# Patient Record
Sex: Female | Born: 1985 | Race: White | Hispanic: No | Marital: Single | Smoking: Former smoker
Health system: Southern US, Community
[De-identification: ages and names within clinical notes are randomized; demographics above are authoritative.]

## PROBLEM LIST (undated history)

## (undated) ENCOUNTER — Inpatient Hospital Stay (HOSPITAL_COMMUNITY): Payer: Self-pay

## (undated) DIAGNOSIS — F909 Attention-deficit hyperactivity disorder, unspecified type: Secondary | ICD-10-CM

## (undated) DIAGNOSIS — B009 Herpesviral infection, unspecified: Secondary | ICD-10-CM

## (undated) HISTORY — PX: NO PAST SURGERIES: SHX2092

---
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
## 2008-07-03 ENCOUNTER — Ambulatory Visit: Payer: Self-pay | Admitting: Diagnostic Radiology

## 2008-07-03 ENCOUNTER — Emergency Department (HOSPITAL_BASED_OUTPATIENT_CLINIC_OR_DEPARTMENT_OTHER): Admission: EM | Admit: 2008-07-03 | Discharge: 2008-07-03 | Payer: Self-pay | Admitting: Emergency Medicine

## 2009-10-27 ENCOUNTER — Inpatient Hospital Stay (HOSPITAL_COMMUNITY): Admission: AD | Admit: 2009-10-27 | Discharge: 2009-10-27 | Payer: Self-pay | Admitting: Obstetrics & Gynecology

## 2009-12-27 ENCOUNTER — Inpatient Hospital Stay (HOSPITAL_COMMUNITY): Admission: AD | Admit: 2009-12-27 | Discharge: 2009-12-30 | Payer: Self-pay | Admitting: Obstetrics and Gynecology

## 2010-04-26 LAB — CBC
HCT: 30.5 % — ABNORMAL LOW (ref 36.0–46.0)
HCT: 39.4 % (ref 36.0–46.0)
Hemoglobin: 10.3 g/dL — ABNORMAL LOW (ref 12.0–15.0)
Hemoglobin: 13.4 g/dL (ref 12.0–15.0)
MCH: 32.3 pg (ref 26.0–34.0)
MCH: 32.3 pg (ref 26.0–34.0)
MCHC: 33.7 g/dL (ref 30.0–36.0)
MCHC: 34.1 g/dL (ref 30.0–36.0)
MCV: 94.7 fL (ref 78.0–100.0)
MCV: 95.8 fL (ref 78.0–100.0)
Platelets: 198 10*3/uL (ref 150–400)
Platelets: 232 10*3/uL (ref 150–400)
RBC: 3.19 MIL/uL — ABNORMAL LOW (ref 3.87–5.11)
RBC: 4.16 MIL/uL (ref 3.87–5.11)
RDW: 12.1 % (ref 11.5–15.5)
RDW: 12.4 % (ref 11.5–15.5)
WBC: 15.4 10*3/uL — ABNORMAL HIGH (ref 4.0–10.5)
WBC: 19.9 10*3/uL — ABNORMAL HIGH (ref 4.0–10.5)

## 2010-04-26 LAB — RPR: RPR Ser Ql: NONREACTIVE

## 2010-04-28 LAB — URINALYSIS, ROUTINE W REFLEX MICROSCOPIC
Bilirubin Urine: NEGATIVE
Glucose, UA: NEGATIVE mg/dL
Ketones, ur: NEGATIVE mg/dL
Leukocytes, UA: NEGATIVE
Nitrite: NEGATIVE
Protein, ur: NEGATIVE mg/dL
Specific Gravity, Urine: 1.015 (ref 1.005–1.030)
Urobilinogen, UA: 0.2 mg/dL (ref 0.0–1.0)
pH: 7 (ref 5.0–8.0)

## 2010-04-28 LAB — URINE MICROSCOPIC-ADD ON

## 2010-10-24 ENCOUNTER — Emergency Department (HOSPITAL_BASED_OUTPATIENT_CLINIC_OR_DEPARTMENT_OTHER)
Admission: EM | Admit: 2010-10-24 | Discharge: 2010-10-24 | Disposition: A | Discharge status: Home / Self Care | Payer: Self-pay | Attending: Emergency Medicine | Admitting: Emergency Medicine

## 2010-10-24 DIAGNOSIS — R509 Fever, unspecified: Secondary | ICD-10-CM | POA: Insufficient documentation

## 2010-10-24 DIAGNOSIS — B9789 Other viral agents as the cause of diseases classified elsewhere: Secondary | ICD-10-CM | POA: Insufficient documentation

## 2010-10-24 DIAGNOSIS — J988 Other specified respiratory disorders: Secondary | ICD-10-CM

## 2010-10-24 LAB — URINALYSIS, ROUTINE W REFLEX MICROSCOPIC
Bilirubin Urine: NEGATIVE
Leukocytes, UA: NEGATIVE
Nitrite: NEGATIVE
Specific Gravity, Urine: 1.007 (ref 1.005–1.030)
Urobilinogen, UA: 1 mg/dL (ref 0.0–1.0)
pH: 6.5 (ref 5.0–8.0)

## 2010-10-24 MED ORDER — ACETAMINOPHEN 325 MG PO TABS
650.0000 mg | ORAL_TABLET | Freq: Once | ORAL | Status: AC
  Administered 2010-10-24: 650 mg via ORAL
  Filled 2010-10-24: qty 2

## 2010-10-24 NOTE — ED Notes (Signed)
Fever-was seen at [facility]-?dx -rx amoxil, tessalon perles

## 2010-10-24 NOTE — ED Provider Notes (Signed)
History     CSN: [id number] Arrival date & time: 10/24/2010  8:57 PM  Chief Complaint  Patient presents with  . Fever   The history is provided by the patient.  SEEN AT [facility] Saturday INTO Sunday FOR FEVER, SORETHROAT, BODY ACHES COUGH, AT TIMES PRODUCTIVE, MILD HEADACHE, SOME VOMITING NOW RESOLVED. SOME DIZZZINESS. THERE GIVEN FLUIDS BLOOD CULTURE LABS AND CXR. XRAY WAS NEGATIVE. RX WITH AMOXICILLIN, NOT BETTER. NO SICK CONTACT EX FOR SON WITH MILDER SIMILAR ILLNESS. FEVER TO 102 TMAX.   History reviewed. No pertinent past medical history.  History reviewed. No pertinent past surgical history.  No family history on file.  History  Substance Use Topics  . Smoking status: Former Games developer  . Smokeless tobacco: Not on file  . Alcohol Use: Yes    OB History    Grav Para Term Preterm Abortions TAB SAB Ect Mult Living                  Review of Systems  Constitutional: Positive for fever, chills and fatigue.  HENT: Positive for congestion and sore throat. Negative for ear pain, trouble swallowing, neck pain and neck stiffness.   Eyes: Negative for photophobia and redness.  Respiratory: Positive for cough. Negative for chest tightness, shortness of breath and wheezing.   Cardiovascular: Negative for chest pain, palpitations and leg swelling.  Gastrointestinal: Negative for nausea, vomiting, abdominal pain and diarrhea.  Genitourinary: Negative for dysuria and hematuria.  Musculoskeletal: Positive for myalgias. Negative for back pain.  Skin: Negative for rash.  Neurological: Positive for dizziness and headaches. Negative for syncope, weakness and numbness.  Psychiatric/Behavioral: Negative for confusion.    Physical Exam  BP 113/79  Pulse 104  Temp(Src) 102.9 F (39.4 C) (Oral)  Resp 18  Ht 5\' 2"  (1.575 m)  Wt 123 lb (55.792 kg)  BMI 22.50 kg/m2  SpO2 99%  LMP 09/29/2010  Physical Exam  Nursing note and vitals reviewed. Constitutional: She is oriented to  person, place, and time. She appears well-developed and well-nourished. No distress.  HENT:  Head: Normocephalic and atraumatic.  Right Ear: External ear normal.  Left Ear: External ear normal.  Mouth/Throat: Oropharynx is clear and moist.  Eyes: Conjunctivae and EOM are normal. Pupils are equal, round, and reactive to light. No scleral icterus.  Neck: Normal range of motion. Neck supple.  Cardiovascular: Normal rate, regular rhythm and normal heart sounds.   No murmur heard.      SLIGHTLY TACHY  Pulmonary/Chest: Effort normal and breath sounds normal. No respiratory distress. She exhibits no tenderness.  Abdominal: Soft. Bowel sounds are normal. There is no tenderness.  Musculoskeletal: Normal range of motion. She exhibits no edema.  Lymphadenopathy:    She has no cervical adenopathy.  Neurological: She is alert and oriented to person, place, and time. She displays normal reflexes. No cranial nerve deficit. She exhibits normal muscle tone. Coordination normal.  Skin: Skin is warm and dry. No rash noted. No erythema.       PRE-EXISTING FOLLICULITIS TO ARMS AND LEGS LONG STANDING.     ED Course  Procedures  Results for orders placed during the hospital encounter of 10/24/10  URINALYSIS, ROUTINE W REFLEX MICROSCOPIC      Component Value Range   Color, Urine YELLOW  YELLOW    Appearance CLEAR  CLEAR    Specific Gravity, Urine 1.007  1.005 - 1.030    pH 6.5  5.0 - 8.0    Glucose, UA NEGATIVE  NEGATIVE (mg/dL)   Hgb urine dipstick NEGATIVE  NEGATIVE    Bilirubin Urine NEGATIVE  NEGATIVE    Ketones, ur NEGATIVE  NEGATIVE (mg/dL)   Protein, ur NEGATIVE  NEGATIVE (mg/dL)   Urobilinogen, UA 1.0  0.0 - 1.0 (mg/dL)   Nitrite NEGATIVE  NEGATIVE    Leukocytes, UA NEGATIVE  NEGATIVE      MDM OFFERED MONO TESTING BUT PATIENT REFUSES WANTS TO GO HOME. MOST LIKELY VIRAL URI LIKE ILLNESS ALREADY ON AMOXICILLIN WHICH WOULD COVER STREP AND JUST HAD NEGATIVE CXR YESTERDAY AT [facility].  SIG FEVER BUT NOT TOXIC. ALSO HAD BLOOD CULTURE AND LAB WORK YESTERDAY.      Shelda Jakes, MD 10/25/10 0530

## 2010-10-24 NOTE — ED Notes (Signed)
Pt states she does not want blood drawn.

## 2010-10-24 NOTE — ED Notes (Signed)
Pt was seen at [facility] sat for same given abx and tesslon pearls for cough. Pt states she is still running fever and has body aches. Pt states sore throat is improved. Pt has fever on arrival to ED tonight, but has not taken any Tylenol for fever today.

## 2010-10-31 ENCOUNTER — Encounter (HOSPITAL_BASED_OUTPATIENT_CLINIC_OR_DEPARTMENT_OTHER): Payer: Self-pay | Admitting: *Deleted

## 2010-10-31 ENCOUNTER — Emergency Department (HOSPITAL_BASED_OUTPATIENT_CLINIC_OR_DEPARTMENT_OTHER)
Admission: EM | Admit: 2010-10-31 | Discharge: 2010-10-31 | Disposition: A | Discharge status: Home / Self Care | Payer: Self-pay | Attending: Emergency Medicine | Admitting: Emergency Medicine

## 2010-10-31 DIAGNOSIS — H6592 Unspecified nonsuppurative otitis media, left ear: Secondary | ICD-10-CM

## 2010-10-31 DIAGNOSIS — H9209 Otalgia, unspecified ear: Secondary | ICD-10-CM | POA: Insufficient documentation

## 2010-10-31 DIAGNOSIS — H6590 Unspecified nonsuppurative otitis media, unspecified ear: Secondary | ICD-10-CM | POA: Insufficient documentation

## 2010-10-31 MED ORDER — ANTIPYRINE-BENZOCAINE 5.4-1.4 % OT SOLN
2.0000 [drp] | Freq: Once | OTIC | Status: AC
  Administered 2010-10-31: 2 [drp] via OTIC
  Filled 2010-10-31: qty 10

## 2010-10-31 MED ORDER — ZITHROMAX Z-PAK 250 MG PO TABS: 250.0000 mg | ORAL_TABLET | Freq: Every day | ORAL | Status: AC

## 2010-10-31 NOTE — ED Notes (Signed)
Pt states she has had left ear pressure and sensation of fullness since last Tuesday.  Pt states her hearing in left ear is increasingly muffled.  Pt describes sensation as "a bunch of cotton balls in there."  Pt also has cough productive of white sputum.  Pt was seen here on Monday past for fever, cough/sinus congestion and at [facility] for same the Saturday before.  Pt denies fever and N/V currently.  Pt's left tympanic membrane is red, but intact.

## 2010-10-31 NOTE — ED Provider Notes (Addendum)
History     CSN: [id number] Arrival date & time: 10/31/2010  9:41 AM   Chief Complaint  Patient presents with  . Otalgia    Right ear     (Include location/radiation/quality/duration/timing/severity/associated sxs/prior treatment) Patient is a 25 y.o. female presenting with ear pain. The history is provided by the patient.  Otalgia The current episode started yesterday. There is pain in the left ear. There has been no fever.   Patient recently treated for sore throat and URI. Has 2 more doses of amoxicillin to complete her course she has had intermittent nasal congestion and rhinorrhea. Mild intermittent dry cough. She does note a new fullness and pain in her left year. Denies any fever or drainage  History reviewed. No pertinent past medical history.   History reviewed. No pertinent past surgical history.  No family history on file.  History  Substance Use Topics  . Smoking status: Former Games developer  . Smokeless tobacco: Not on file  . Alcohol Use: Yes     Occasional ETOH use - social drinker.    OB History    Grav Para Term Preterm Abortions TAB SAB Ect Mult Living                  Review of Systems  HENT: Positive for ear pain.   All other systems reviewed and are negative.    Allergies  Review of patient's allergies indicates no known allergies.  Home Medications   Current Outpatient Rx  Name Route Sig Dispense Refill  . AMOXICILLIN 875 MG PO TABS Oral Take 875 mg by mouth 2 (two) times daily.      . B COMPLEX PO TABS Oral Take 1 tablet by mouth daily.      Marland Kitchen BENZONATATE 100 MG PO CAPS Oral Take 100 mg by mouth 3 (three) times daily as needed. cough     . BIOTIN 5000 PO Oral Take 2 tablets by mouth daily.        Physical Exam    BP 112/75  Pulse 87  Temp(Src) 98.3 F (36.8 C) (Oral)  Resp 20  SpO2 98%  LMP 10/26/2010  Physical Exam  Constitutional: She is oriented to person, place, and time. She appears well-developed and well-nourished.  HENT:    Head: Normocephalic and atraumatic.  Right Ear: External ear normal.  Left Ear: External ear normal.       Mild erythema and shininess to left tympanic membrane. No obvious fluid collection drainage or bleeding.  Eyes: Conjunctivae and EOM are normal. Pupils are equal, round, and reactive to light.  Neck: Neck supple.  Cardiovascular: Normal rate and regular rhythm.  Exam reveals no gallop and no friction rub.   No murmur heard. Pulmonary/Chest: Breath sounds normal. She has no wheezes. She has no rales. She exhibits no tenderness.  Abdominal: Soft. Bowel sounds are normal. She exhibits no distension. There is no tenderness. There is no rebound and no guarding.  Musculoskeletal: Normal range of motion.  Neurological: She is alert and oriented to person, place, and time. No cranial nerve deficit. Coordination normal.  Skin: Skin is warm and dry. No rash noted.  Psychiatric: She has a normal mood and affect.    ED Course  Procedures  Results for orders placed during the hospital encounter of 10/24/10  URINALYSIS, ROUTINE W REFLEX MICROSCOPIC      Component Value Range   Color, Urine YELLOW  YELLOW    Appearance CLEAR  CLEAR    Specific Gravity, Urine  1.007  1.005 - 1.030    pH 6.5  5.0 - 8.0    Glucose, UA NEGATIVE  NEGATIVE (mg/dL)   Hgb urine dipstick NEGATIVE  NEGATIVE    Bilirubin Urine NEGATIVE  NEGATIVE    Ketones, ur NEGATIVE  NEGATIVE (mg/dL)   Protein, ur NEGATIVE  NEGATIVE (mg/dL)   Urobilinogen, UA 1.0  0.0 - 1.0 (mg/dL)   Nitrite NEGATIVE  NEGATIVE    Leukocytes, UA NEGATIVE  NEGATIVE    No results found.   No diagnosis found.   MDM Pt is seen and examined;  Initial history and physical completed.  Will follow.         Kilan Banfill A. Patrica Duel, MD 10/31/10 1004  Feleshia Zundel A. Patrica Duel, MD 10/31/10 1006

## 2012-10-18 ENCOUNTER — Encounter (HOSPITAL_BASED_OUTPATIENT_CLINIC_OR_DEPARTMENT_OTHER): Payer: Self-pay | Admitting: *Deleted

## 2012-10-18 ENCOUNTER — Emergency Department (HOSPITAL_BASED_OUTPATIENT_CLINIC_OR_DEPARTMENT_OTHER)
Admission: EM | Admit: 2012-10-18 | Discharge: 2012-10-18 | Disposition: A | Discharge status: Home / Self Care | Payer: Self-pay | Attending: Emergency Medicine | Admitting: Emergency Medicine

## 2012-10-18 DIAGNOSIS — J029 Acute pharyngitis, unspecified: Secondary | ICD-10-CM | POA: Insufficient documentation

## 2012-10-18 DIAGNOSIS — Z87891 Personal history of nicotine dependence: Secondary | ICD-10-CM | POA: Insufficient documentation

## 2012-10-18 DIAGNOSIS — Z79899 Other long term (current) drug therapy: Secondary | ICD-10-CM | POA: Insufficient documentation

## 2012-10-18 DIAGNOSIS — R509 Fever, unspecified: Secondary | ICD-10-CM | POA: Insufficient documentation

## 2012-10-18 DIAGNOSIS — R51 Headache: Secondary | ICD-10-CM | POA: Insufficient documentation

## 2012-10-18 MED ORDER — LIDOCAINE VISCOUS 2 % MT SOLN
15.0000 mL | Freq: Once | OROMUCOSAL | Status: AC
  Administered 2012-10-18: 15 mL via OROMUCOSAL
  Filled 2012-10-18: qty 15

## 2012-10-18 MED ORDER — AMOXICILLIN 875 MG PO TABS: 875.0000 mg | ORAL_TABLET | Freq: Two times a day (BID) | ORAL | Status: DC

## 2012-10-18 MED ORDER — DEXAMETHASONE 1 MG/ML PO CONC
10.0000 mg | Freq: Once | ORAL | Status: AC
  Administered 2012-10-18: 10 mg via ORAL
  Filled 2012-10-18: qty 1

## 2012-10-18 MED ORDER — AMOXICILLIN 500 MG PO CAPS
500.0000 mg | ORAL_CAPSULE | Freq: Once | ORAL | Status: AC
  Administered 2012-10-18: 500 mg via ORAL
  Filled 2012-10-18: qty 1

## 2012-10-18 NOTE — ED Provider Notes (Signed)
CSN: [id number]     Arrival date & time 10/18/12  0903 History   First MD Initiated Contact with Patient 10/18/12 [phone]     Chief Complaint  Patient presents with  . Sore Throat   (Consider location/radiation/quality/duration/timing/severity/associated sxs/prior Treatment) HPI Patient presents with several days of sore throat, headache, fever. Over the past 24 hours symptoms have worsened, despite using ibuprofen. There is no associated dyspnea, chest pain, vomiting, diarrhea. Fever is subjective. There is no confusion, disorientation, visual changes or discharge. Patient states that she has a family member who has ongoing bacterial infection of his eyes, ears, throat. Patient was in her usual state of health prior to onset of symptoms.  History reviewed. No pertinent past medical history. History reviewed. No pertinent past surgical history. No family history on file. History  Substance Use Topics  . Smoking status: Former Games developer  . Smokeless tobacco: Not on file  . Alcohol Use: No     Comment: Occasional ETOH use - social drinker.   OB History   Grav Para Term Preterm Abortions TAB SAB Ect Mult Living                 Review of Systems  All other systems reviewed and are negative.    Allergies  Review of patient's allergies indicates no known allergies.  Home Medications   Current Outpatient Rx  Name  Route  Sig  Dispense  Refill  . ibuprofen (ADVIL,MOTRIN) 200 MG tablet   Oral   Take 600 mg by mouth every 6 (six) hours as needed for pain.         Marland Kitchen amoxicillin (AMOXIL) 875 MG tablet   Oral   Take 875 mg by mouth 2 (two) times daily.           Marland Kitchen b complex vitamins tablet   Oral   Take 1 tablet by mouth daily.           . benzonatate (TESSALON) 100 MG capsule   Oral   Take 100 mg by mouth 3 (three) times daily as needed. cough          . BIOTIN 5000 PO   Oral   Take 2 tablets by mouth daily.            BP 114/75  Pulse 83  Temp(Src) 98.3 F  (36.8 C) (Oral)  Resp 20  SpO2 100%  LMP 10/11/2012 Physical Exam  Nursing note and vitals reviewed. Constitutional: She is oriented to person, place, and time. She appears well-developed and well-nourished. No distress.  HENT:  Head: Normocephalic and atraumatic.  Prominent tonsils bilaterally without gross exudate.  Uvula is midline, nonedematous.  Eyes: Conjunctivae and EOM are normal.  Cardiovascular: Normal rate and regular rhythm.   Pulmonary/Chest: Effort normal and breath sounds normal. No stridor. No respiratory distress.  Abdominal: She exhibits no distension.  Musculoskeletal: She exhibits no edema.  Lymphadenopathy:       Head (right side): Preauricular adenopathy present.       Head (left side): Preauricular adenopathy present.    She has cervical adenopathy.       Right cervical: Superficial cervical adenopathy present.       Left cervical: Superficial cervical adenopathy present.  Neurological: She is alert and oriented to person, place, and time. No cranial nerve deficit.  Skin: Skin is warm and dry.  Psychiatric: She has a normal mood and affect.    ED Course  Procedures (including critical care time) Labs Review  Labs Reviewed  RAPID STREP SCREEN   Imaging Review No results found.    MDM  No diagnosis found. This young female presents with pharyngitis, headache, fever.  Given the patient's exposure to family member with ongoing strep-like infection, she will receive antibiotics, while strep culture is pending, though the initial rapid strep this was negative. Absent distress, rash, unilateral symptoms, there is little suspicion for a occult abscess or other acute systemic progression.    Gerhard Munch, MD 10/18/12 [phone]

## 2012-10-18 NOTE — ED Notes (Signed)
Patient states she has had a sore throat for the last several days.  Last night felt as if she had a fever and the throat pain worsened.

## 2012-10-21 LAB — CULTURE, GROUP A STREP

## 2013-04-20 ENCOUNTER — Encounter (HOSPITAL_COMMUNITY): Payer: Self-pay | Admitting: *Deleted

## 2013-04-20 ENCOUNTER — Inpatient Hospital Stay (HOSPITAL_COMMUNITY)
Admission: AD | Admit: 2013-04-20 | Discharge: 2013-04-20 | Disposition: A | Discharge status: Home / Self Care | Payer: 59 | Source: Ambulatory Visit | Attending: Obstetrics & Gynecology | Admitting: Obstetrics & Gynecology

## 2013-04-20 DIAGNOSIS — Z87891 Personal history of nicotine dependence: Secondary | ICD-10-CM | POA: Insufficient documentation

## 2013-04-20 DIAGNOSIS — A499 Bacterial infection, unspecified: Secondary | ICD-10-CM

## 2013-04-20 DIAGNOSIS — N760 Acute vaginitis: Secondary | ICD-10-CM

## 2013-04-20 DIAGNOSIS — B9689 Other specified bacterial agents as the cause of diseases classified elsewhere: Secondary | ICD-10-CM | POA: Insufficient documentation

## 2013-04-20 LAB — URINALYSIS, ROUTINE W REFLEX MICROSCOPIC
BILIRUBIN URINE: NEGATIVE
Glucose, UA: NEGATIVE mg/dL
Hgb urine dipstick: NEGATIVE
Ketones, ur: NEGATIVE mg/dL
Leukocytes, UA: NEGATIVE
NITRITE: NEGATIVE
PROTEIN: NEGATIVE mg/dL
SPECIFIC GRAVITY, URINE: 1.025 (ref 1.005–1.030)
UROBILINOGEN UA: 0.2 mg/dL (ref 0.0–1.0)
pH: 6.5 (ref 5.0–8.0)

## 2013-04-20 LAB — WET PREP, GENITAL
TRICH WET PREP: NONE SEEN
Yeast Wet Prep HPF POC: NONE SEEN

## 2013-04-20 LAB — POCT PREGNANCY, URINE: PREG TEST UR: NEGATIVE

## 2013-04-20 MED ORDER — METRONIDAZOLE 500 MG PO TABS: 500.0000 mg | ORAL_TABLET | Freq: Two times a day (BID) | ORAL | Status: DC

## 2013-04-20 NOTE — MAU Provider Note (Signed)
History     CSN: [id number]  Arrival date and time: 04/20/13 1238   First Provider Initiated Contact with Patient 04/20/13 1313      Chief Complaint  Patient presents with  . Vaginitis   HPI [name] is a 28 y.o. G1P1001 who presents to MAU today with complaint of vaginal tenderness and discharge. The patient states that she has noted tenderness of the external genitalia. She denies rash or irritation. She states a yellow-white discharge that is thin, but sometimes thicker x days. She denies vaginal bleeding, pelvic pain, fever, N/V/D or UTI symptoms. She does not occasional pressure with urination.   OB History   Grav Para Term Preterm Abortions TAB SAB Ect Mult Living   1 1 1       1       Past Medical History  Diagnosis Date  . Medical history non-contributory     Past Surgical History  Procedure Laterality Date  . No past surgeries      History reviewed. No pertinent family history.  History  Substance Use Topics  . Smoking status: Former Games developer  . Smokeless tobacco: Never Used  . Alcohol Use: No     Comment: Occasional ETOH use - social drinker.    Allergies: No Known Allergies  Prescriptions prior to admission  Medication Sig Dispense Refill  . GARCINIA CAMBOGIA-CHROMIUM PO Take 1 tablet by mouth 3 (three) times daily.      Marland Kitchen ibuprofen (ADVIL,MOTRIN) 200 MG tablet Take 400 mg by mouth every 6 (six) hours as needed for moderate pain.         Review of Systems  Constitutional: Negative for fever and malaise/fatigue.  Gastrointestinal: Negative for nausea, vomiting, abdominal pain, diarrhea and constipation.  Genitourinary: Negative for dysuria, urgency, frequency and hematuria.       Neg - vaginal bleeding + vaginal discharge   Physical Exam   Blood pressure 107/61, pulse 112, temperature 98.5 F (36.9 C), resp. rate 18, height 5\' 1"  (1.549 m), weight 119 lb (53.978 kg), last menstrual period 03/30/2013.  Physical Exam  Constitutional:  She is oriented to person, place, and time. She appears well-developed and well-nourished. No distress.  HENT:  Head: Normocephalic and atraumatic.  Cardiovascular: Normal rate, regular rhythm and normal heart sounds.   Respiratory: Effort normal and breath sounds normal. No respiratory distress.  GI: Soft. Bowel sounds are normal. She exhibits no distension and no mass. There is no tenderness. There is no rebound and no guarding.  Genitourinary: Uterus is not enlarged and not tender. Cervix exhibits no motion tenderness, no discharge and no friability. Right adnexum displays no mass and no tenderness. Left adnexum displays no mass and no tenderness. No bleeding around the vagina. Vaginal discharge (small amount of thin, white discharge noted) found.  Neurological: She is alert and oriented to person, place, and time.  Skin: Skin is warm and dry. No erythema.  Psychiatric: She has a normal mood and affect.   Results for orders placed during the hospital encounter of 04/20/13 (from the past 24 hour(s))  URINALYSIS, ROUTINE W REFLEX MICROSCOPIC     Status: None   Collection Time    04/20/13  1:00 PM      Result Value Ref Range   Color, Urine YELLOW  YELLOW   APPearance CLEAR  CLEAR   Specific Gravity, Urine 1.025  1.005 - 1.030   pH 6.5  5.0 - 8.0   Glucose, UA NEGATIVE  NEGATIVE mg/dL  Hgb urine dipstick NEGATIVE  NEGATIVE   Bilirubin Urine NEGATIVE  NEGATIVE   Ketones, ur NEGATIVE  NEGATIVE mg/dL   Protein, ur NEGATIVE  NEGATIVE mg/dL   Urobilinogen, UA 0.2  0.0 - 1.0 mg/dL   Nitrite NEGATIVE  NEGATIVE   Leukocytes, UA NEGATIVE  NEGATIVE  WET PREP, GENITAL     Status: Abnormal   Collection Time    04/20/13  1:20 PM      Result Value Ref Range   Yeast Wet Prep HPF POC NONE SEEN  NONE SEEN   Trich, Wet Prep NONE SEEN  NONE SEEN   Clue Cells Wet Prep HPF POC FEW (*) NONE SEEN   WBC, Wet Prep HPF POC MANY (*) NONE SEEN  POCT PREGNANCY, URINE     Status: None   Collection Time     04/20/13  1:30 PM      Result Value Ref Range   Preg Test, Ur NEGATIVE  NEGATIVE     MAU Course  Procedures None  MDM UPT - negative UA, Wet prep, GC/Chlamydia today  Assessment and Plan  A: Bacterial vaginosis  P: Discharge home Rx for Flagyl sent to patient's pharmacy Discussed hygiene products for avoiding recurrent BV Patient may return to MAU as needed or if her condition were to change or worsen  Freddi StarrJulie N Ethier, PA-C  04/20/2013, 1:42 PM

## 2013-04-20 NOTE — Discharge Instructions (Signed)

## 2013-04-20 NOTE — MAU Note (Signed)
Pt presents with complaints of a possible yeast infection. She says that her vaginal area feels sensitive.  Denies any vaginal bleeding

## 2013-04-21 LAB — GC/CHLAMYDIA PROBE AMP
CT Probe RNA: NEGATIVE
GC PROBE AMP APTIMA: NEGATIVE

## 2013-12-15 ENCOUNTER — Encounter (HOSPITAL_COMMUNITY): Payer: Self-pay | Admitting: *Deleted

## 2014-02-03 LAB — OB RESULTS CONSOLE GC/CHLAMYDIA
CHLAMYDIA, DNA PROBE: NEGATIVE
Gonorrhea: NEGATIVE

## 2014-02-13 NOTE — L&D Delivery Note (Signed)
Delivery Note Patient pushed well for 1 hour with epidural.  Fetal head was delivered at 2314.  The usual gentle traction did not deliver the anterior left shoulder.  Shoulder dystocia was noted and additional nursing support was request.  McRoberts and suprapubic pressure did not alleviate the dystocia.  The posterior arm was unable to be delivered as the hand was posterior to the back.  Corkscrew was unsuccessful.  At this time, the neonatal team and additional support was requested.  An additional attempt to deliver the posterior arm was made. At this time (3 minutes), Dr. Jolayne Pantheronstant was present in the room and stepped into assist.  Ultimately, at 4.5 minutes at 2318 the posterior right arm was delivered, followed by shoulders and body.  The baby was quickly passed of to the neonatal team.     He was initially pale and floppy, but was quickly resuscitated with BBO2.  He was noted to be moving both upper extremities well without defects.    APGAR: 4, 9; weight 8 lb 2.5 oz (3700 g).   Placenta status: spontaneous.  Cord: 3 vessels with the following complications: .  Cord pH: arterial and venous blood gases collected, both clotted prior to being run by lab  Anesthesia: Epidural  Episiotomy: None Lacerations: 1st degree Suture Repair: 3.0 vicryl Est. Blood Loss (mL):  200 mL  Mom to postpartum.  Baby to Couplet care / Skin to Skin.  Hildagard Sobecki GEFFEL Rhilynn Preyer 08/28/2014, 12:39 AM

## 2014-02-18 LAB — OB RESULTS CONSOLE RPR: RPR: NONREACTIVE

## 2014-02-18 LAB — OB RESULTS CONSOLE HEPATITIS B SURFACE ANTIGEN: Hepatitis B Surface Ag: NEGATIVE

## 2014-02-18 LAB — OB RESULTS CONSOLE ABO/RH: RH Type: POSITIVE

## 2014-02-18 LAB — OB RESULTS CONSOLE HIV ANTIBODY (ROUTINE TESTING): HIV: NONREACTIVE

## 2014-02-18 LAB — OB RESULTS CONSOLE RUBELLA ANTIBODY, IGM: Rubella: IMMUNE

## 2014-06-17 ENCOUNTER — Observation Stay (HOSPITAL_COMMUNITY)
Admission: AD | Admit: 2014-06-17 | Discharge: 2014-06-18 | Disposition: A | Discharge status: Home / Self Care | Payer: 59 | Source: Ambulatory Visit | Attending: Obstetrics and Gynecology | Admitting: Obstetrics and Gynecology

## 2014-06-17 ENCOUNTER — Inpatient Hospital Stay (HOSPITAL_COMMUNITY): Payer: 59

## 2014-06-17 ENCOUNTER — Encounter (HOSPITAL_COMMUNITY): Payer: Self-pay | Admitting: *Deleted

## 2014-06-17 DIAGNOSIS — R3989 Other symptoms and signs involving the genitourinary system: Secondary | ICD-10-CM | POA: Diagnosis present

## 2014-06-17 DIAGNOSIS — Z87891 Personal history of nicotine dependence: Secondary | ICD-10-CM | POA: Insufficient documentation

## 2014-06-17 DIAGNOSIS — Z3A28 28 weeks gestation of pregnancy: Secondary | ICD-10-CM | POA: Insufficient documentation

## 2014-06-17 DIAGNOSIS — O9989 Other specified diseases and conditions complicating pregnancy, childbirth and the puerperium: Secondary | ICD-10-CM | POA: Diagnosis not present

## 2014-06-17 LAB — COMPREHENSIVE METABOLIC PANEL
ALBUMIN: 2.9 g/dL — AB (ref 3.5–5.0)
ALT: 9 U/L — ABNORMAL LOW (ref 14–54)
ANION GAP: 4 — AB (ref 5–15)
AST: 16 U/L (ref 15–41)
Alkaline Phosphatase: 71 U/L (ref 38–126)
BILIRUBIN TOTAL: 0.4 mg/dL (ref 0.3–1.2)
BUN: 10 mg/dL (ref 6–20)
CALCIUM: 8.1 mg/dL — AB (ref 8.9–10.3)
CHLORIDE: 109 mmol/L (ref 101–111)
CO2: 23 mmol/L (ref 22–32)
CREATININE: 0.53 mg/dL (ref 0.44–1.00)
Glucose, Bld: 87 mg/dL (ref 70–99)
Potassium: 3.6 mmol/L (ref 3.5–5.1)
Sodium: 136 mmol/L (ref 135–145)
Total Protein: 6.3 g/dL — ABNORMAL LOW (ref 6.5–8.1)

## 2014-06-17 LAB — CBC
HCT: 33.8 % — ABNORMAL LOW (ref 36.0–46.0)
HEMOGLOBIN: 11.4 g/dL — AB (ref 12.0–15.0)
MCH: 31.1 pg (ref 26.0–34.0)
MCHC: 33.7 g/dL (ref 30.0–36.0)
MCV: 92.3 fL (ref 78.0–100.0)
Platelets: 185 10*3/uL (ref 150–400)
RBC: 3.66 MIL/uL — ABNORMAL LOW (ref 3.87–5.11)
RDW: 12 % (ref 11.5–15.5)
WBC: 11.4 10*3/uL — ABNORMAL HIGH (ref 4.0–10.5)

## 2014-06-17 LAB — URINALYSIS, ROUTINE W REFLEX MICROSCOPIC
BILIRUBIN URINE: NEGATIVE
Glucose, UA: NEGATIVE mg/dL
Ketones, ur: NEGATIVE mg/dL
Leukocytes, UA: NEGATIVE
NITRITE: NEGATIVE
PH: 6.5 (ref 5.0–8.0)
Protein, ur: NEGATIVE mg/dL
SPECIFIC GRAVITY, URINE: 1.01 (ref 1.005–1.030)
Urobilinogen, UA: 0.2 mg/dL (ref 0.0–1.0)

## 2014-06-17 LAB — URINE MICROSCOPIC-ADD ON

## 2014-06-17 MED ORDER — OXYCODONE-ACETAMINOPHEN 5-325 MG PO TABS
1.0000 | ORAL_TABLET | ORAL | Status: DC | PRN
  Filled 2014-06-17: qty 2

## 2014-06-17 MED ORDER — OXYCODONE-ACETAMINOPHEN 5-325 MG PO TABS
2.0000 | ORAL_TABLET | Freq: Once | ORAL | Status: AC
  Administered 2014-06-17: 2 via ORAL
  Filled 2014-06-17: qty 2

## 2014-06-17 MED ORDER — ACETAMINOPHEN 325 MG PO TABS: 650.0000 mg | ORAL_TABLET | ORAL | Status: DC | PRN

## 2014-06-17 MED ORDER — PRENATAL MULTIVITAMIN CH
1.0000 | ORAL_TABLET | Freq: Every day | ORAL | Status: DC
  Filled 2014-06-17: qty 1

## 2014-06-17 MED ORDER — ZOLPIDEM TARTRATE 5 MG PO TABS
5.0000 mg | ORAL_TABLET | Freq: Every evening | ORAL | Status: DC | PRN
  Administered 2014-06-18: 5 mg via ORAL
  Filled 2014-06-17: qty 1

## 2014-06-17 MED ORDER — PHENAZOPYRIDINE HCL 100 MG PO TABS
200.0000 mg | ORAL_TABLET | Freq: Once | ORAL | Status: AC
  Administered 2014-06-17: 200 mg via ORAL
  Filled 2014-06-17: qty 2

## 2014-06-17 MED ORDER — DOCUSATE SODIUM 100 MG PO CAPS
100.0000 mg | ORAL_CAPSULE | Freq: Every day | ORAL | Status: DC
  Administered 2014-06-17: 100 mg via ORAL
  Filled 2014-06-17 (×3): qty 1

## 2014-06-17 MED ORDER — CALCIUM CARBONATE ANTACID 500 MG PO CHEW
2.0000 | CHEWABLE_TABLET | ORAL | Status: DC | PRN
  Filled 2014-06-17: qty 2

## 2014-06-17 NOTE — MAU Provider Note (Signed)
History     CSN: [id number]  Arrival date and time: 06/17/14 1646   First Provider Initiated Contact with Patient 06/17/14 1751      Chief Complaint  Patient presents with  . Vaginal Pain   HPI   [name] is a 29 y.o. female G2P1001 at 3363w1d who presents with sharp shooting pain that feels like it is coming from her bladder. The pain feels like she needs to use the bathroom; it started off as feeling like she needed to pee really bad, following urination she had no relief. The pain started a few days ago, however got worse today.  She feels like she may have a UTI, although she has never had a UTI before. The pain is described as a pulsating pain that is very strong and then goes away.  When the pain comes she rates it an 8/10.    + fetal movement Denies vaginal bleeding.   OB History    Gravida Para Term Preterm AB TAB SAB Ectopic Multiple Living   2 1 1       1       Past Medical History  Diagnosis Date  . Medical history non-contributory     Past Surgical History  Procedure Laterality Date  . No past surgeries      Family History  Problem Relation Age of Onset  . Diabetes Father     History  Substance Use Topics  . Smoking status: Former Games developermoker  . Smokeless tobacco: Never Used  . Alcohol Use: No     Comment: Occasional ETOH use - social drinker.    Allergies: No Known Allergies  Prescriptions prior to admission  Medication Sig Dispense Refill Last Dose  . Butalbital-APAP-Caffeine 50-300-40 MG CAPS Take 1 tablet by mouth every 6 (six) hours as needed. For headaches  0 2 weeks ago  . calcium carbonate (TUMS - DOSED IN MG ELEMENTAL CALCIUM) 500 MG chewable tablet Chew 1 tablet by mouth daily as needed for indigestion or heartburn.   Past Week at Unknown time  . Prenatal Vit-Fe Fumarate-FA (PRENATAL MULTIVITAMIN) TABS tablet Take 1 tablet by mouth daily at 12 noon.   06/16/2014 at Unknown time  . metroNIDAZOLE (FLAGYL) 500 MG tablet Take 1 tablet (500  mg total) by mouth 2 (two) times daily. (Patient not taking: Reported on 06/17/2014) 14 tablet 0    Results for orders placed or performed during the hospital encounter of 06/17/14 (from the past 48 hour(s))  Urinalysis, Routine w reflex microscopic     Status: Abnormal   Collection Time: 06/17/14  4:48 PM  Result Value Ref Range   Color, Urine STRAW (A) YELLOW   APPearance CLEAR CLEAR   Specific Gravity, Urine 1.010 1.005 - 1.030   pH 6.5 5.0 - 8.0   Glucose, UA NEGATIVE NEGATIVE mg/dL   Hgb urine dipstick TRACE (A) NEGATIVE   Bilirubin Urine NEGATIVE NEGATIVE   Ketones, ur NEGATIVE NEGATIVE mg/dL   Protein, ur NEGATIVE NEGATIVE mg/dL   Urobilinogen, UA 0.2 0.0 - 1.0 mg/dL   Nitrite NEGATIVE NEGATIVE   Leukocytes, UA NEGATIVE NEGATIVE  Urine microscopic-add on     Status: Abnormal   Collection Time: 06/17/14  4:48 PM  Result Value Ref Range   Squamous Epithelial / LPF FEW (A) RARE   WBC, UA 0-2 <3 WBC/hpf   RBC / HPF 0-2 <3 RBC/hpf    Review of Systems  Constitutional: Negative for fever and chills.  Genitourinary: Positive for urgency  and frequency. Negative for dysuria, hematuria and flank pain.  Musculoskeletal: Positive for back pain.   Physical Exam   Blood pressure 101/69, pulse 102, temperature 98.7 F (37.1 C), temperature source Oral, resp. rate 20, height 5\' 2"  (1.575 m), weight 65.862 kg (145 lb 3.2 oz).  Physical Exam  Constitutional: She is oriented to person, place, and time. She appears well-developed and well-nourished. She appears distressed.  Patient is tearful   Respiratory: Effort normal.  GI: Soft. She exhibits no distension. There is no tenderness. There is no rebound, no guarding and no CVA tenderness.  Genitourinary:  Dilation: Closed Effacement (%): 30 Station:  (high) Exam by:: Jerrye BushyJ Rausch NP  Musculoskeletal: Normal range of motion.  Neurological: She is alert and oriented to person, place, and time.  Skin: Skin is warm. She is not diaphoretic.    Fetal Tracing: Baseline: 130 bpm  Variability: Moderate  Accelerations: 15x15 Decelerations: quick variables  Toco: Quiet    MAU Course  Procedures  None  MDM  Pyridium 200 mg PO> patient states no relief  US for cervical length> normal cervical length per preliminary report  Currently rates her "bladder pain" 9/10  Discussed patient with Dr. Claiborne Billingsallahan, concerned about the patients discomfort with no relief from pyridium. We will admit the patient for 23 hour observation for pain management.  2 mild contractions noted on toco following US  Assessment and Plan   A: 1. Bladder pain    P:  Admit to [facility] for observation Percocet for pain Q4 PRN  CBC CMP repeat tomorrow  Strain all urine   Duane LopeJennifer I Rasch, NP 06/17/2014 5:58 PM

## 2014-06-17 NOTE — H&P (Signed)
29 y.o. 2771w1d  G2P1001 comes in c/o of sharp vaginal pain that started this afternoon. When asked to point to the pain she states its inside the vagina.  She denies abd pain or contractions.  She states she is peeing all the time and she thinks the pain may be associated with a UTI.  Pain does not change with movement.  No alleviating factors.  Otherwise has good fetal movement and no bleeding.  Past Medical History  Diagnosis Date  . Medical history non-contributory     Past Surgical History  Procedure Laterality Date  . No past surgeries      OB History  Gravida Para Term Preterm AB SAB TAB Ectopic Multiple Living  2 1 1       1     # Outcome Date GA Lbr Len/2nd Weight Sex Delivery Anes PTL Lv  2 Current           1 Term 12/28/09    M Vag-Spont EPI  Y      History   Social History  . Marital Status: Single    Spouse Name: N/A  . Number of Children: N/A  . Years of Education: N/A   Occupational History  . Not on file.   Social History Main Topics  . Smoking status: Former Games developermoker  . Smokeless tobacco: Never Used  . Alcohol Use: No     Comment: Occasional ETOH use - social drinker.  . Drug Use: No  . Sexual Activity: No   Other Topics Concern  . Not on file   Social History Narrative   Review of patient's allergies indicates no known allergies.    Filed Vitals:   06/17/14 2034  BP: 109/72  Pulse: 95  Temp: 98.2 F (36.8 C)  Resp: 20     Lungs/Cor:  NAD Abdomen:  soft, gravid Back: no CVA tenderness Ex:  no cords, erythema SVE:  Closed, long FHTs:  140, good STV, NST R Toco:  Since transferring from MAU ctx noted avery 2-785min, pt states she is not feeling them, has felt one contraction today US: in process   A/P  Admit for 23 hour observation for intermittentshooting pelvic pain - vaginal in nature Urine culture pending - trace blood, no CVA tenderness of radiating pain at the flanks, so unlikely nephrolithiasis but will strain urine IVF Percocet for  pain control overnight Cervix closed and pt denying ctx, toco now showing some ctx - will continue to monitor and RN to recheck if pt develops pain or ctx do not resolve with IVF Reassess pain in am  BoykinALLAHAN, Vickii Volland

## 2014-06-17 NOTE — MAU Note (Signed)
Urine in lab 

## 2014-06-17 NOTE — MAU Note (Signed)
C/o sharp spasm-like pains over her bladder over past week but today the pain is more constant and stronger;

## 2014-06-17 NOTE — MAU Note (Signed)
Has not taken any meds for her pain;

## 2014-06-17 NOTE — MAU Note (Signed)
Has been feeling vaginal pain for the past several days, now pain is constant, feels pulsating sensation when urinating. Denies vaginal bleeding, lower back pain.

## 2014-06-18 LAB — CBC WITH DIFFERENTIAL/PLATELET
Basophils Absolute: 0 10*3/uL (ref 0.0–0.1)
Basophils Relative: 0 % (ref 0–1)
Eosinophils Absolute: 0.1 10*3/uL (ref 0.0–0.7)
Eosinophils Relative: 2 % (ref 0–5)
HCT: 29.5 % — ABNORMAL LOW (ref 36.0–46.0)
Hemoglobin: 9.7 g/dL — ABNORMAL LOW (ref 12.0–15.0)
LYMPHS ABS: 2.1 10*3/uL (ref 0.7–4.0)
LYMPHS PCT: 26 % (ref 12–46)
MCH: 30.6 pg (ref 26.0–34.0)
MCHC: 32.9 g/dL (ref 30.0–36.0)
MCV: 93.1 fL (ref 78.0–100.0)
Monocytes Absolute: 0.9 10*3/uL (ref 0.1–1.0)
Monocytes Relative: 11 % (ref 3–12)
Neutro Abs: 5 10*3/uL (ref 1.7–7.7)
Neutrophils Relative %: 61 % (ref 43–77)
Platelets: 168 10*3/uL (ref 150–400)
RBC: 3.17 MIL/uL — ABNORMAL LOW (ref 3.87–5.11)
RDW: 12.1 % (ref 11.5–15.5)
WBC: 8.1 10*3/uL (ref 4.0–10.5)

## 2014-06-18 LAB — COMPREHENSIVE METABOLIC PANEL
ALT: 8 U/L — ABNORMAL LOW (ref 14–54)
AST: 19 U/L (ref 15–41)
Albumin: 2.5 g/dL — ABNORMAL LOW (ref 3.5–5.0)
Alkaline Phosphatase: 56 U/L (ref 38–126)
Anion gap: 5 (ref 5–15)
BUN: 8 mg/dL (ref 6–20)
CALCIUM: 8 mg/dL — AB (ref 8.9–10.3)
CO2: 24 mmol/L (ref 22–32)
CREATININE: 0.52 mg/dL (ref 0.44–1.00)
Chloride: 108 mmol/L (ref 101–111)
GLUCOSE: 82 mg/dL (ref 70–99)
POTASSIUM: 3.7 mmol/L (ref 3.5–5.1)
Sodium: 137 mmol/L (ref 135–145)
TOTAL PROTEIN: 5.5 g/dL — AB (ref 6.5–8.1)
Total Bilirubin: 0.2 mg/dL — ABNORMAL LOW (ref 0.3–1.2)

## 2014-06-18 LAB — CULTURE, OB URINE
COLONY COUNT: NO GROWTH
Culture: NO GROWTH
SPECIAL REQUESTS: NORMAL

## 2014-06-18 MED ORDER — FERROUS SULFATE 325 (65 FE) MG PO TBEC: 325.0000 mg | DELAYED_RELEASE_TABLET | Freq: Three times a day (TID) | ORAL | Status: DC

## 2014-06-18 MED ORDER — LACTATED RINGERS IV SOLN
INTRAVENOUS | Status: DC
  Administered 2014-06-17 – 2014-06-18 (×2): via INTRAVENOUS

## 2014-06-18 NOTE — Discharge Summary (Signed)
Obstetric Discharge Summary Reason for Admission: vaginal pain / preterm contractions Prenatal Procedures: NST and ultrasound  HEMOGLOBIN  Date Value Ref Range Status  06/18/2014 9.7* 12.0 - 15.0 g/dL Final   HCT  Date Value Ref Range Status  06/18/2014 29.5* 36.0 - 46.0 % Final    Physical Exam:  General: alert, cooperative and no distress ABD: Soft NT, ND, Gravid EXT:  No clubbing, cyanosis, edema, no calf tenderness  Discharge Diagnoses: preterm contractions, possible kidney stones  Discharge Information: Date: 06/18/2014 Activity: unrestricted Diet: routine Medications: PNV and Iron Condition: stable Instructions: refer to practice specific booklet Discharge to: home   Newborn Data: This patient has no babies on file. Patient still pregnant  Essie HartINN, Keivon Garden STACIA 06/18/2014, 8:20 AM

## 2014-06-18 NOTE — Discharge Instructions (Signed)
Abdominal Pain, Women °Abdominal (stomach, pelvic, or belly) pain can be caused by many things. It is important to tell your doctor: °· The location of the pain. °· Does it come and go or is it present all the time? °· Are there things that start the pain (eating certain foods, exercise)? °· Are there other symptoms associated with the pain (fever, nausea, vomiting, diarrhea)? °All of this is helpful to know when trying to find the cause of the pain. °CAUSES  °· Stomach: virus or bacteria infection, or ulcer. °· Intestine: appendicitis (inflamed appendix), regional ileitis (Crohn's disease), ulcerative colitis (inflamed colon), irritable bowel syndrome, diverticulitis (inflamed diverticulum of the colon), or cancer of the stomach or intestine. °· Gallbladder disease or stones in the gallbladder. °· Kidney disease, kidney stones, or infection. °· Pancreas infection or cancer. °· Fibromyalgia (pain disorder). °· Diseases of the female organs: °¨ Uterus: fibroid (non-cancerous) tumors or infection. °¨ Fallopian tubes: infection or tubal pregnancy. °¨ Ovary: cysts or tumors. °¨ Pelvic adhesions (scar tissue). °¨ Endometriosis (uterus lining tissue growing in the pelvis and on the pelvic organs). °¨ Pelvic congestion syndrome (female organs filling up with blood just before the menstrual period). °¨ Pain with the menstrual period. °¨ Pain with ovulation (producing an egg). °¨ Pain with an IUD (intrauterine device, birth control) in the uterus. °¨ Cancer of the female organs. °· Functional pain (pain not caused by a disease, may improve without treatment). °· Psychological pain. °· Depression. °DIAGNOSIS  °Your doctor will decide the seriousness of your pain by doing an examination. °· Blood tests. °· X-rays. °· Ultrasound. °· CT scan (computed tomography, special type of X-ray). °· MRI (magnetic resonance imaging). °· Cultures, for infection. °· Barium enema (dye inserted in the large intestine, to better view it with  X-rays). °· Colonoscopy (looking in intestine with a lighted tube). °· Laparoscopy (minor surgery, looking in abdomen with a lighted tube). °· Major abdominal exploratory surgery (looking in abdomen with a large incision). °TREATMENT  °The treatment will depend on the cause of the pain.  °· Many cases can be observed and treated at home. °· Over-the-counter medicines recommended by your caregiver. °· Prescription medicine. °· Antibiotics, for infection. °· Birth control pills, for painful periods or for ovulation pain. °· Hormone treatment, for endometriosis. °· Nerve blocking injections. °· Physical therapy. °· Antidepressants. °· Counseling with a psychologist or psychiatrist. °· Minor or major surgery. °HOME CARE INSTRUCTIONS  °· Do not take laxatives, unless directed by your caregiver. °· Take over-the-counter pain medicine only if ordered by your caregiver. Do not take aspirin because it can cause an upset stomach or bleeding. °· Try a clear liquid diet (broth or water) as ordered by your caregiver. Slowly move to a bland diet, as tolerated, if the pain is related to the stomach or intestine. °· Have a thermometer and take your temperature several times a day, and record it. °· Bed rest and sleep, if it helps the pain. °· Avoid sexual intercourse, if it causes pain. °· Avoid stressful situations. °· Keep your follow-up appointments and tests, as your caregiver orders. °· If the pain does not go away with medicine or surgery, you may try: °¨ Acupuncture. °¨ Relaxation exercises (yoga, meditation). °¨ Group therapy. °¨ Counseling. °SEEK MEDICAL CARE IF:  °· You notice certain foods cause stomach pain. °· Your home care treatment is not helping your pain. °· You need stronger pain medicine. °· You want your IUD removed. °· You feel faint or   lightheaded. °· You develop nausea and vomiting. °· You develop a rash. °· You are having side effects or an allergy to your medicine. °SEEK IMMEDIATE MEDICAL CARE IF:  °· Your  pain does not go away or gets worse. °· You have a fever. °· Your pain is felt only in portions of the abdomen. The right side could possibly be appendicitis. The left lower portion of the abdomen could be colitis or diverticulitis. °· You are passing blood in your stools (bright red or black tarry stools, with or without vomiting). °· You have blood in your urine. °· You develop chills, with or without a fever. °· You pass out. °MAKE SURE YOU:  °· Understand these instructions. °· Will watch your condition. °· Will get help right away if you are not doing well or get worse. °Document Released: 11/27/2006 Document Revised: 06/16/2013 Document Reviewed: 12/17/2008 °Exit[employer]® Patient Information ©2015 [employer]. This information is not intended to replace advice given to you by your health care provider. Make sure you discuss any questions you have with your health care provider. ° °

## 2014-07-22 ENCOUNTER — Inpatient Hospital Stay (HOSPITAL_COMMUNITY): Payer: 59

## 2014-07-22 ENCOUNTER — Inpatient Hospital Stay (HOSPITAL_COMMUNITY)
Admission: AD | Admit: 2014-07-22 | Discharge: 2014-07-22 | Disposition: A | Discharge status: Home / Self Care | Payer: 59 | Source: Ambulatory Visit | Attending: Obstetrics and Gynecology | Admitting: Obstetrics and Gynecology

## 2014-07-22 ENCOUNTER — Encounter (HOSPITAL_COMMUNITY): Payer: Self-pay | Admitting: *Deleted

## 2014-07-22 DIAGNOSIS — O4703 False labor before 37 completed weeks of gestation, third trimester: Secondary | ICD-10-CM

## 2014-07-22 DIAGNOSIS — Z3A33 33 weeks gestation of pregnancy: Secondary | ICD-10-CM | POA: Insufficient documentation

## 2014-07-22 DIAGNOSIS — Z87891 Personal history of nicotine dependence: Secondary | ICD-10-CM | POA: Diagnosis not present

## 2014-07-22 DIAGNOSIS — O9989 Other specified diseases and conditions complicating pregnancy, childbirth and the puerperium: Secondary | ICD-10-CM | POA: Diagnosis not present

## 2014-07-22 DIAGNOSIS — R109 Unspecified abdominal pain: Secondary | ICD-10-CM

## 2014-07-22 DIAGNOSIS — O26899 Other specified pregnancy related conditions, unspecified trimester: Secondary | ICD-10-CM

## 2014-07-22 HISTORY — DX: Herpesviral infection, unspecified: B00.9

## 2014-07-22 LAB — URINALYSIS, ROUTINE W REFLEX MICROSCOPIC
Bilirubin Urine: NEGATIVE
Glucose, UA: NEGATIVE mg/dL
Hgb urine dipstick: NEGATIVE
KETONES UR: NEGATIVE mg/dL
Nitrite: NEGATIVE
PROTEIN: NEGATIVE mg/dL
SPECIFIC GRAVITY, URINE: 1.01 (ref 1.005–1.030)
Urobilinogen, UA: 0.2 mg/dL (ref 0.0–1.0)
pH: 6.5 (ref 5.0–8.0)

## 2014-07-22 LAB — URINE MICROSCOPIC-ADD ON

## 2014-07-22 MED ORDER — NIFEDIPINE 20 MG PO CAPS: 20.0000 mg | ORAL_CAPSULE | Freq: Three times a day (TID) | ORAL | Status: DC

## 2014-07-22 MED ORDER — NIFEDIPINE 10 MG PO CAPS
10.0000 mg | ORAL_CAPSULE | Freq: Once | ORAL | Status: AC
  Administered 2014-07-22: 10 mg via ORAL
  Filled 2014-07-22: qty 1

## 2014-07-22 MED ORDER — LACTATED RINGERS IV BOLUS (SEPSIS)
1000.0000 mL | Freq: Once | INTRAVENOUS | Status: AC
  Administered 2014-07-22: 1000 mL via INTRAVENOUS

## 2014-07-22 NOTE — MAU Note (Signed)
Contractions since around 0200 pm today, no history of PTL>

## 2014-07-22 NOTE — MAU Note (Signed)
Pt states U/C's began at 1400 and became every 5 minutes about 1600.  Pt saw Dr. Steele BergPenn who states she was 2 cm per pt.  Pt denies vaginal bleeding but has had an increase in mucous/whitish vaginal discharge today.  Good fetal movement.

## 2014-07-22 NOTE — MAU Provider Note (Signed)
History     CSN: [id number]  Arrival date and time: 07/22/14 1716   First Provider Initiated Contact with Patient 07/22/14 1800      Chief Complaint  Patient presents with  . Contractions   HPI    [name] is a 29 y.o. female G2P1001 at [redacted]w[redacted]d presenting from her OB office with contractions. She reports at 1400 she started experiencing  inconsistant contractions that changed to regular- consistent, painful contractions around 1600. She was seen in the office today by Dr. Mora Appl who reports the patients cervix was 2 cm. The patient does not have a history of preterm contractions/ preterm labor.  Currently the contractions are irregular, however still painful. The contractions make her stomach very tight and makes it difficult to take a deep breath. When the contraction comes she rates her pain 4-5/10. The vaginal pressure is bothering her more than the contractions.   OB History    Gravida Para Term Preterm AB TAB SAB Ectopic Multiple Living   Past Medical History  Diagnosis Date  . Medical history non-contributory     Past Surgical History  Procedure Laterality Date  . No past surgeries      Family History  Problem Relation Age of Onset  . Diabetes Father     History  Substance Use Topics  . Smoking status: Former Games developer  . Smokeless tobacco: Never Used  . Alcohol Use: No     Comment: Occasional ETOH use - social drinker.    Allergies: No Known Allergies  Prescriptions prior to admission  Medication Sig Dispense Refill Last Dose  . Butalbital-APAP-Caffeine 50-300-40 MG CAPS Take 1 tablet by mouth every 6 (six) hours as needed. For headaches  0 2 weeks ago  . calcium carbonate (TUMS - DOSED IN MG ELEMENTAL CALCIUM) 500 MG chewable tablet Chew 1 tablet by mouth daily as needed for indigestion or heartburn.   Past Week at Unknown time  . ferrous sulfate 325 (65 FE) MG EC tablet Take 1 tablet (325 mg total) by mouth 3 (three) times daily  with meals. 90 tablet 3   . Prenatal Vit-Fe Fumarate-FA (PRENATAL MULTIVITAMIN) TABS tablet Take 1 tablet by mouth daily at 12 noon.   06/16/2014 at Unknown time   Results for orders placed or performed during the hospital encounter of 07/22/14 (from the past 48 hour(s))  Urinalysis, Routine w reflex microscopic (not at [facility])     Status: Abnormal   Collection Time: 07/22/14  5:30 PM  Result Value Ref Range   Color, Urine YELLOW YELLOW   APPearance HAZY (A) CLEAR   Specific Gravity, Urine 1.010 1.005 - 1.030   pH 6.5 5.0 - 8.0   Glucose, UA NEGATIVE NEGATIVE mg/dL   Hgb urine dipstick NEGATIVE NEGATIVE   Bilirubin Urine NEGATIVE NEGATIVE   Ketones, ur NEGATIVE NEGATIVE mg/dL   Protein, ur NEGATIVE NEGATIVE mg/dL   Urobilinogen, UA 0.2 0.0 - 1.0 mg/dL   Nitrite NEGATIVE NEGATIVE   Leukocytes, UA SMALL (A) NEGATIVE  Urine microscopic-add on     Status: Abnormal   Collection Time: 07/22/14  5:30 PM  Result Value Ref Range   Squamous Epithelial / LPF FEW (A) RARE   WBC, UA 3-6 <3 WBC/hpf   RBC / HPF 0-2 <3 RBC/hpf   Bacteria, UA MANY (A) RARE   Urine-Other MUCOUS PRESENT     Review of Systems  Constitutional: Negative  for fever and chills.  Genitourinary: Negative for dysuria and urgency.       Denies vaginal bleeding Denies leaking of fluid    Physical Exam   Blood pressure 108/66, pulse 120, temperature 98.1 F (36.7 C), temperature source Oral, resp. rate 20, SpO2 97 %.  Physical Exam  Constitutional: She is oriented to person, place, and time. She appears well-developed and well-nourished. No distress.  HENT:  Head: Normocephalic.  Eyes: Pupils are equal, round, and reactive to light.  Neck: Neck supple.  Respiratory: Effort normal.  GI: Soft. There is no tenderness.  Musculoskeletal: Normal range of motion.  Neurological: She is alert and oriented to person, place, and time.  Skin: Skin is warm. She is not diaphoretic.  Psychiatric: Her behavior is normal.    MAU  Course  Procedures  None  MDM  Dr. Mora ApplPinn informed NP of patient coming to MAU.  LR bolus Procardia 1 dose- pt got relief of contractions but started wearing off after ultrasound 2nd dose Procardia 10mg  given  US for cervical length.  Preliminary report cervix 3.06cm- reported to Dr. Waynard ReedsKendra Ross Urine culture pending  Report given to Pamelia HoitSusan Lineberry NP who resumes care of the patient. The patient is in US at this time.  Duane LopeJennifer I Rasch, NP   Assessment and Plan  Abdominal pain in pregnancy- Procardia 10mg  q 4-6 hrs prn contractions #30 0RF Threatened preterm labor F/u with scheduled OB appointment, sooner if return of sx, spotting or bleeding or LOF

## 2014-08-05 LAB — OB RESULTS CONSOLE GBS: STREP GROUP B AG: NEGATIVE

## 2014-08-26 ENCOUNTER — Encounter (HOSPITAL_COMMUNITY): Payer: Self-pay | Admitting: *Deleted

## 2014-08-26 ENCOUNTER — Inpatient Hospital Stay (HOSPITAL_COMMUNITY)
Admission: AD | Admit: 2014-08-26 | Discharge: 2014-08-26 | Disposition: A | Discharge status: Home / Self Care | Payer: 59 | Source: Ambulatory Visit | Attending: Obstetrics and Gynecology | Admitting: Obstetrics and Gynecology

## 2014-08-26 DIAGNOSIS — Z3A38 38 weeks gestation of pregnancy: Secondary | ICD-10-CM

## 2014-08-26 DIAGNOSIS — Z87891 Personal history of nicotine dependence: Secondary | ICD-10-CM

## 2014-08-26 DIAGNOSIS — Z3493 Encounter for supervision of normal pregnancy, unspecified, third trimester: Secondary | ICD-10-CM

## 2014-08-26 NOTE — MAU Note (Signed)
Urine sent to Lab

## 2014-08-26 NOTE — MAU Note (Signed)
Pt c/o ctx about every 5 min. Denies SROM or bleeding at this time. Good fetal movement reported

## 2014-08-27 ENCOUNTER — Inpatient Hospital Stay (HOSPITAL_COMMUNITY): Payer: 59 | Admitting: Anesthesiology

## 2014-08-27 ENCOUNTER — Encounter (HOSPITAL_COMMUNITY): Payer: Self-pay

## 2014-08-27 ENCOUNTER — Inpatient Hospital Stay (HOSPITAL_COMMUNITY)
Admission: AD | Admit: 2014-08-27 | Discharge: 2014-08-29 | DRG: 775 | Disposition: A | Discharge status: Home / Self Care | Payer: 59 | Source: Ambulatory Visit | Attending: Obstetrics | Admitting: Obstetrics

## 2014-08-27 DIAGNOSIS — Z3A38 38 weeks gestation of pregnancy: Secondary | ICD-10-CM | POA: Diagnosis present

## 2014-08-27 DIAGNOSIS — Z87891 Personal history of nicotine dependence: Secondary | ICD-10-CM | POA: Diagnosis not present

## 2014-08-27 LAB — TYPE AND SCREEN
ABO/RH(D): B POS
Antibody Screen: NEGATIVE

## 2014-08-27 LAB — CBC
HEMATOCRIT: 33.2 % — AB (ref 36.0–46.0)
HEMOGLOBIN: 10.8 g/dL — AB (ref 12.0–15.0)
MCH: 28.1 pg (ref 26.0–34.0)
MCHC: 32.5 g/dL (ref 30.0–36.0)
MCV: 86.5 fL (ref 78.0–100.0)
PLATELETS: 204 10*3/uL (ref 150–400)
RBC: 3.84 MIL/uL — ABNORMAL LOW (ref 3.87–5.11)
RDW: 14.4 % (ref 11.5–15.5)
WBC: 11.4 10*3/uL — ABNORMAL HIGH (ref 4.0–10.5)

## 2014-08-27 LAB — ABO/RH: ABO/RH(D): B POS

## 2014-08-27 MED ORDER — FENTANYL 2.5 MCG/ML BUPIVACAINE 1/10 % EPIDURAL INFUSION (WH - ANES): 14.0000 mL/h | INTRAMUSCULAR | Status: DC | PRN

## 2014-08-27 MED ORDER — EPHEDRINE 5 MG/ML INJ
10.0000 mg | INTRAVENOUS | Status: DC | PRN
  Filled 2014-08-27: qty 2

## 2014-08-27 MED ORDER — PHENYLEPHRINE 40 MCG/ML (10ML) SYRINGE FOR IV PUSH (FOR BLOOD PRESSURE SUPPORT)
PREFILLED_SYRINGE | INTRAVENOUS | Status: DC
Start: 2014-08-27 — End: 2014-08-27
  Filled 2014-08-27: qty 20

## 2014-08-27 MED ORDER — LACTATED RINGERS IV SOLN
500.0000 mL | INTRAVENOUS | Status: DC | PRN
  Administered 2014-08-27: 500 mL via INTRAVENOUS

## 2014-08-27 MED ORDER — ONDANSETRON HCL 4 MG/2ML IJ SOLN: 4.0000 mg | Freq: Four times a day (QID) | INTRAMUSCULAR | Status: DC | PRN

## 2014-08-27 MED ORDER — CITRIC ACID-SODIUM CITRATE 334-500 MG/5ML PO SOLN: 30.0000 mL | ORAL | Status: DC | PRN

## 2014-08-27 MED ORDER — ACETAMINOPHEN 325 MG PO TABS: 650.0000 mg | ORAL_TABLET | ORAL | Status: DC | PRN

## 2014-08-27 MED ORDER — DIPHENHYDRAMINE HCL 50 MG/ML IJ SOLN: 12.5000 mg | INTRAMUSCULAR | Status: DC | PRN

## 2014-08-27 MED ORDER — OXYCODONE-ACETAMINOPHEN 5-325 MG PO TABS: 1.0000 | ORAL_TABLET | ORAL | Status: DC | PRN

## 2014-08-27 MED ORDER — BUTORPHANOL TARTRATE 1 MG/ML IJ SOLN: 1.0000 mg | INTRAMUSCULAR | Status: DC | PRN

## 2014-08-27 MED ORDER — OXYTOCIN 40 UNITS IN LACTATED RINGERS INFUSION - SIMPLE MED: 1.0000 m[IU]/min | INTRAVENOUS | Status: DC

## 2014-08-27 MED ORDER — LACTATED RINGERS IV SOLN
INTRAVENOUS | Status: DC
  Administered 2014-08-27: 21:00:00 via INTRAVENOUS

## 2014-08-27 MED ORDER — TERBUTALINE SULFATE 1 MG/ML IJ SOLN: 0.2500 mg | Freq: Once | INTRAMUSCULAR | Status: AC | PRN

## 2014-08-27 MED ORDER — EPHEDRINE 5 MG/ML INJ: 10.0000 mg | INTRAVENOUS | Status: DC | PRN

## 2014-08-27 MED ORDER — OXYCODONE-ACETAMINOPHEN 5-325 MG PO TABS: 2.0000 | ORAL_TABLET | ORAL | Status: DC | PRN

## 2014-08-27 MED ORDER — LIDOCAINE HCL (PF) 1 % IJ SOLN
30.0000 mL | INTRAMUSCULAR | Status: DC | PRN
  Filled 2014-08-27: qty 30

## 2014-08-27 MED ORDER — OXYTOCIN 40 UNITS IN LACTATED RINGERS INFUSION - SIMPLE MED
1.0000 m[IU]/min | INTRAVENOUS | Status: DC
  Filled 2014-08-27: qty 1000

## 2014-08-27 MED ORDER — FENTANYL 2.5 MCG/ML BUPIVACAINE 1/10 % EPIDURAL INFUSION (WH - ANES)
INTRAMUSCULAR | Status: AC
  Filled 2014-08-27: qty 125

## 2014-08-27 MED ORDER — OXYTOCIN BOLUS FROM INFUSION
500.0000 mL | INTRAVENOUS | Status: DC
  Administered 2014-08-27: 500 mL via INTRAVENOUS

## 2014-08-27 MED ORDER — LIDOCAINE HCL (PF) 1 % IJ SOLN
INTRAMUSCULAR | Status: DC | PRN
  Administered 2014-08-27: 6 mL
  Administered 2014-08-27: 4 mL

## 2014-08-27 MED ORDER — PHENYLEPHRINE 40 MCG/ML (10ML) SYRINGE FOR IV PUSH (FOR BLOOD PRESSURE SUPPORT): 80.0000 ug | PREFILLED_SYRINGE | INTRAVENOUS | Status: DC | PRN

## 2014-08-27 MED ORDER — FENTANYL 2.5 MCG/ML BUPIVACAINE 1/10 % EPIDURAL INFUSION (WH - ANES)
INTRAMUSCULAR | Status: DC | PRN
  Administered 2014-08-27: 14 mL/h via EPIDURAL

## 2014-08-27 MED ORDER — PHENYLEPHRINE 40 MCG/ML (10ML) SYRINGE FOR IV PUSH (FOR BLOOD PRESSURE SUPPORT)
80.0000 ug | PREFILLED_SYRINGE | INTRAVENOUS | Status: DC | PRN
  Administered 2014-08-27: 80 ug via INTRAVENOUS
  Filled 2014-08-27: qty 2
  Filled 2014-08-27: qty 20

## 2014-08-27 MED ORDER — FENTANYL 2.5 MCG/ML BUPIVACAINE 1/10 % EPIDURAL INFUSION (WH - ANES)
14.0000 mL/h | INTRAMUSCULAR | Status: DC | PRN
  Administered 2014-08-27: 14 mL/h via EPIDURAL

## 2014-08-27 MED ORDER — OXYTOCIN 40 UNITS IN LACTATED RINGERS INFUSION - SIMPLE MED: 62.5000 mL/h | INTRAVENOUS | Status: DC

## 2014-08-27 NOTE — Anesthesia Preprocedure Evaluation (Signed)
Anesthesia Evaluation  Patient identified by MRN, date of birth, ID band Patient awake and Patient confused    Reviewed: Allergy & Precautions, H&P , NPO status , Patient's Chart, lab work & pertinent test results  Airway Mallampati: II       Dental   Pulmonary former smoker,  breath sounds clear to auscultation  Pulmonary exam normal       Cardiovascular Exercise Tolerance: Good Normal cardiovascular examRhythm:regular Rate:Normal     Neuro/Psych    GI/Hepatic   Endo/Other    Renal/GU      Musculoskeletal   Abdominal   Peds  Hematology   Anesthesia Other Findings   Reproductive/Obstetrics (+) Pregnancy                             Anesthesia Physical Anesthesia Plan  ASA: II  Anesthesia Plan:    Post-op Pain Management:    Induction:   Airway Management Planned:   Additional Equipment:   Intra-op Plan:   Post-operative Plan:   Informed Consent: I have reviewed the patients History and Physical, chart, labs and discussed the procedure including the risks, benefits and alternatives for the proposed anesthesia with the patient or authorized representative who has indicated his/her understanding and acceptance.     Plan Discussed with:   Anesthesia Plan Comments:         Anesthesia Quick Evaluation

## 2014-08-27 NOTE — Anesthesia Procedure Notes (Signed)
Epidural Patient location during procedure: OB Start time: 08/27/2014 6:08 PM End time: 08/27/2014 6:24 PM  Staffing Anesthesiologist: Sebastian AcheMANNY, Syndey Jaskolski  Preanesthetic Checklist Completed: patient identified, site marked, surgical consent, pre-op evaluation, timeout performed, IV checked, risks and benefits discussed and monitors and equipment checked  Epidural Patient position: sitting Prep: site prepped and draped and DuraPrep Patient monitoring: heart rate, continuous pulse ox and blood pressure Approach: midline Location: L3-L4 Injection technique: LOR air  Needle:  Needle type: Tuohy  Needle gauge: 17 G Needle length: 9 cm and 9 Needle insertion depth: 5 cm Catheter type: closed end flexible Catheter size: 19 Gauge Catheter at skin depth: 14 cm Test dose: negative  Assessment Events: blood not aspirated, injection not painful, no injection resistance, negative IV test and no paresthesia  Additional Notes   Patient tolerated the insertion well without complications.  Scoliosis curve to LeftReason for block:procedure for pain

## 2014-08-27 NOTE — Progress Notes (Signed)
Contractions more painful  EFM: 140s, mod var, + accels, no decels Toco: q2-3 min SVE: 6/90/0, forebag AROM clear fluid  Anticipate SVD Pitocin if no cervical change over next hour

## 2014-08-27 NOTE — MAU Note (Signed)
Direct admit from office. Can go to room 174 in 15 minutes

## 2014-08-27 NOTE — H&P (Signed)
29 y.o. G2P1001 @ 5566w2d presents with SROM and early labor.  Was seen yesterday PM w ctx q5-7 min, cvx unchanged at 3 cm.  Has contracted most of the evening, still q5 min.  On speculum exam, grossly ruptured and 4-5 cm.  Otherwise has good fetal movement and no bleeding.  Past Medical History  Diagnosis Date  . HSV infection     Past Surgical History  Procedure Laterality Date  . No past surgeries      OB History  Gravida Para Term Preterm AB SAB TAB Ectopic Multiple Living  2 1 1       1     # Outcome Date GA Lbr Len/2nd Weight Sex Delivery Anes PTL Lv  2 Current           1 Term 12/28/09 944w6d  3.912 kg (8 lb 10 oz) M Vag-Vacuum EPI  Y     Complications: Shoulder Dystocia      History   Social History  . Marital Status: Single    Spouse Name: N/A  . Number of Children: N/A  . Years of Education: N/A   Occupational History  . Not on file.   Social History Main Topics  . Smoking status: Former Games developermoker  . Smokeless tobacco: Never Used  . Alcohol Use: No     Comment: Occasional ETOH use - social drinker.  . Drug Use: No  . Sexual Activity: No   Other Topics Concern  . Not on file   Social History Narrative   Review of patient's allergies indicates no known allergies.    Prenatal Transfer Tool  Maternal Diabetes: No Genetic Screening: Normal Maternal Ultrasounds/Referrals: Normal Fetal Ultrasounds or other Referrals:  None Maternal Substance Abuse:  No Significant Maternal Medications:  None Significant Maternal Lab Results: Lab values include: Group B Strep negative   Other PNC: uncomplicated.  Growth US on 7/11: vtx, 7lb 12 oz (86%)  Filed Vitals:   08/27/14 1326  BP: 128/78  Pulse: 110  Temp: 98.4 F (36.9 C)  Resp: 18     General:  NAD Lungs: CTAB Cardiac: RRR Abdomen:  soft, gravid, EFW 8# Ex:  Trace b/l edema SVE:  4-5/80/-1 SSE: no active lesions/ulcers on vagina/ vulva/ buttocks.  ROM by pooling w clear fluid FHTs:  150s, mod var, +  accels Toco:  q4-5 min in office   A/P   29 y.o. G2P1001 4966w2d presents with ROM at term IV pain meds/ epidural prn H/o shoulder dystocia: EFW on 7/11 :7lb 12oz.  Reviewed risks of recurrence. Declined primary c/s.   Desires to avoid pitocin augmentation.  Will ambulate.  If no cervical change in 2 hrs, will start pitocin.   FSR/ vtx/ GBS neg  Alvis Pulcini GEFFEL Kelechi Astarita

## 2014-08-27 NOTE — MAU Note (Signed)
Pt presents stating that she was seen in the office this morning and sent her for admission due to SROM. Cervix 4-5cm in office today. Denies bleeding. Reports good fetal movement.

## 2014-08-28 ENCOUNTER — Encounter (HOSPITAL_COMMUNITY): Payer: Self-pay

## 2014-08-28 LAB — CBC
HEMATOCRIT: 32.8 % — AB (ref 36.0–46.0)
HEMOGLOBIN: 10.6 g/dL — AB (ref 12.0–15.0)
MCH: 27.6 pg (ref 26.0–34.0)
MCHC: 32.3 g/dL (ref 30.0–36.0)
MCV: 85.4 fL (ref 78.0–100.0)
PLATELETS: 203 10*3/uL (ref 150–400)
RBC: 3.84 MIL/uL — ABNORMAL LOW (ref 3.87–5.11)
RDW: 14.3 % (ref 11.5–15.5)
WBC: 22.3 10*3/uL — ABNORMAL HIGH (ref 4.0–10.5)

## 2014-08-28 LAB — RPR: RPR: NONREACTIVE

## 2014-08-28 MED ORDER — PRENATAL MULTIVITAMIN CH
1.0000 | ORAL_TABLET | Freq: Every day | ORAL | Status: DC
  Administered 2014-08-28 – 2014-08-29 (×2): 1 via ORAL
  Filled 2014-08-28 (×3): qty 1

## 2014-08-28 MED ORDER — OXYCODONE-ACETAMINOPHEN 5-325 MG PO TABS
1.0000 | ORAL_TABLET | ORAL | Status: DC | PRN
  Administered 2014-08-28 – 2014-08-29 (×2): 1 via ORAL
  Filled 2014-08-28 (×4): qty 1

## 2014-08-28 MED ORDER — IBUPROFEN 600 MG PO TABS
600.0000 mg | ORAL_TABLET | Freq: Four times a day (QID) | ORAL | Status: DC
  Administered 2014-08-28 – 2014-08-29 (×6): 600 mg via ORAL
  Filled 2014-08-28 (×6): qty 1

## 2014-08-28 MED ORDER — OXYCODONE-ACETAMINOPHEN 5-325 MG PO TABS
2.0000 | ORAL_TABLET | ORAL | Status: DC | PRN
  Administered 2014-08-28 (×2): 2 via ORAL
  Filled 2014-08-28: qty 2

## 2014-08-28 MED ORDER — ONDANSETRON HCL 4 MG PO TABS: 4.0000 mg | ORAL_TABLET | ORAL | Status: DC | PRN

## 2014-08-28 MED ORDER — LANOLIN HYDROUS EX OINT: TOPICAL_OINTMENT | CUTANEOUS | Status: DC | PRN

## 2014-08-28 MED ORDER — DIPHENHYDRAMINE HCL 25 MG PO CAPS: 25.0000 mg | ORAL_CAPSULE | Freq: Four times a day (QID) | ORAL | Status: DC | PRN

## 2014-08-28 MED ORDER — SENNOSIDES-DOCUSATE SODIUM 8.6-50 MG PO TABS
2.0000 | ORAL_TABLET | ORAL | Status: DC
  Administered 2014-08-28: 2 via ORAL
  Filled 2014-08-28: qty 2

## 2014-08-28 MED ORDER — ACETAMINOPHEN 325 MG PO TABS: 650.0000 mg | ORAL_TABLET | ORAL | Status: DC | PRN

## 2014-08-28 MED ORDER — DIBUCAINE 1 % RE OINT
1.0000 "application " | TOPICAL_OINTMENT | RECTAL | Status: DC | PRN
  Administered 2014-08-29: 1 via RECTAL
  Filled 2014-08-28: qty 28

## 2014-08-28 MED ORDER — BENZOCAINE-MENTHOL 20-0.5 % EX AERO
1.0000 "application " | INHALATION_SPRAY | CUTANEOUS | Status: DC | PRN
  Administered 2014-08-28: 1 via TOPICAL

## 2014-08-28 MED ORDER — SIMETHICONE 80 MG PO CHEW
80.0000 mg | CHEWABLE_TABLET | ORAL | Status: DC | PRN
  Filled 2014-08-28: qty 1

## 2014-08-28 MED ORDER — TETANUS-DIPHTH-ACELL PERTUSSIS 5-2.5-18.5 LF-MCG/0.5 IM SUSP: 0.5000 mL | Freq: Once | INTRAMUSCULAR | Status: DC

## 2014-08-28 MED ORDER — WITCH HAZEL-GLYCERIN EX PADS
1.0000 "application " | MEDICATED_PAD | CUTANEOUS | Status: DC | PRN
  Administered 2014-08-28: 1 via TOPICAL

## 2014-08-28 MED ORDER — ONDANSETRON HCL 4 MG/2ML IJ SOLN: 4.0000 mg | INTRAMUSCULAR | Status: DC | PRN

## 2014-08-28 NOTE — Progress Notes (Signed)
Post Partum Day 1 Subjective: no complaints, up ad lib, voiding, tolerating PO and breast feeding.   Objective: Blood pressure 107/58, pulse 93, temperature 98.3 F (36.8 C), temperature source Oral, resp. rate 18, height 5\' 2"  (1.575 m), weight 71.923 kg (158 lb 9 oz), SpO2 99 %, unknown if currently breastfeeding.  Physical Exam:  General: alert, cooperative and no distress Lochia: appropriate Uterine Fundus: firm Perineum: no heavy bleeding DVT Evaluation: No evidence of DVT seen on physical exam. Negative Homan's sign. No cords or calf tenderness.   Recent Labs  08/27/14 1130 08/28/14 0527  HGB 10.8* 10.6*  HCT 33.2* 32.8*    Assessment/Plan: Plan for discharge tomorrow, Breastfeeding and Circumcision prior to discharge   LOS: 1 day   Zyshonne Malecha STACIA 08/28/2014, 8:56 AM

## 2014-08-28 NOTE — Lactation Note (Signed)
This note was copied from the chart of Sydney Gerome ApleyCaitlin Nunley. Lactation Consultation Note Initial visit at 18 hours of age.  Mom reports several good feedings, but is unsure if baby is getting enough.   Mom denies pain and describes a good latch.  Mom independently latched on right breast is cradle hold with a shallow latch.  Broke suction and released nipple.  Instructed on cross cradle hold with assist baby opens mouth wide with deep latch and flanged lips.  Mom is able to notice as a good deep latch.  Baby has slow rhythmic sucking.  [facility] given and discussed.  Encouraged to feed with early cues on demand.  Early newborn behavior discussed.  Hand expression demonstrated with colostrum visible. Several questions answered. Mom to call for assist as needed.    Patient Name: Sydney Benjamin [name]'s Date: 08/28/2014 Reason for consult: Initial assessment   Maternal Data Has patient been taught Hand Expression?: Yes Does the patient have breastfeeding experience prior to this delivery?: Yes  Feeding Feeding Type: Breast Fed Length of feed:  (observed several minutes)  LATCH Score/Interventions Latch: Grasps breast easily, tongue down, lips flanged, rhythmical sucking.  Audible Swallowing: A few with stimulation Intervention(s): Skin to skin;Hand expression;Alternate breast massage  Type of Nipple: Everted at rest and after stimulation  Comfort (Breast/Nipple): Soft / non-tender     Hold (Positioning): Assistance needed to correctly position infant at breast and maintain latch. Intervention(s): Breastfeeding basics reviewed;Support Pillows;Position options;Skin to skin  LATCH Score: 8  Lactation Tools Discussed/Used     Consult Status Consult Status: Follow-up Date: 08/29/14 Follow-up type: In-patient    Sydney Benjamin, Arvella MerlesJana Lynn 08/28/2014, 5:30 PM

## 2014-08-28 NOTE — Anesthesia Postprocedure Evaluation (Signed)
  Anesthesia Post-op Note  Patient: Sydney Benjamin  Procedure(s) Performed: * No procedures listed *  Patient Location: Mother/Baby  Anesthesia Type:Epidural  Level of Consciousness: awake and alert   Airway and Oxygen Therapy: Patient Spontanous Breathing  Post-op Pain: mild  Post-op Assessment: Post-op Vital signs reviewed, Patient's Cardiovascular Status Stable, Respiratory Function Stable, No signs of Nausea or vomiting, Pain level controlled, No headache, Spinal receding and Patient able to bend at knees              Post-op Vital Signs: Reviewed  Last Vitals:  Filed Vitals:   08/28/14 1353  BP: 109/73  Pulse: 98  Temp: 36.5 C  Resp: 16    Complications: No apparent anesthesia complications

## 2014-08-29 MED ORDER — IBUPROFEN 600 MG PO TABS: 600.0000 mg | ORAL_TABLET | Freq: Four times a day (QID) | ORAL | Status: DC | PRN

## 2014-08-29 MED ORDER — OXYCODONE-ACETAMINOPHEN 5-325 MG PO TABS: 1.0000 | ORAL_TABLET | ORAL | Status: DC | PRN

## 2014-08-29 NOTE — Discharge Summary (Signed)
Obstetric Discharge Summary Reason for Admission: rupture of membranes Prenatal Procedures: NST and ultrasound Intrapartum Procedures: spontaneous vaginal delivery Postpartum Procedures: none Complications-Operative and Postpartum: 1st degree perineal laceration HEMOGLOBIN  Date Value Ref Range Status  08/28/2014 10.6* 12.0 - 15.0 g/dL Final   HCT  Date Value Ref Range Status  08/28/2014 32.8* 36.0 - 46.0 % Final    Physical Exam:  General: alert, cooperative and no distress Lochia: appropriate Uterine Fundus: firm perineum: healing well, no significant drainage, no significant erythema DVT Evaluation: No evidence of DVT seen on physical exam. Negative Homan's sign. No cords or calf tenderness.  Discharge Diagnoses: Term Pregnancy-delivered  Discharge Information: Date: 08/29/2014 Activity: pelvic rest Diet: routine Medications: Ibuprofen and Percocet Condition: stable Instructions: refer to practice specific booklet Discharge to: home   Newborn Data: Live born female  Birth Weight: 8 lb 2.5 oz (3700 g) APGAR: 4, 9  Home with mother.  Essie Benjamin, Sydney Benjamin Sydney 08/29/2014, 9:19 AM

## 2014-08-29 NOTE — Lactation Note (Signed)
This note was copied from the chart of Sydney Gerome ApleyCaitlin Benjamin. Lactation Consultation Note  Follow up visit made prior to discharge.  Mom states baby is nursing frequently and well.  Reviewed discharge instructions including engorgement treatment.  She plans on seeing LC at pediatricians this week.  Lactation outpatient services and support information reviewed and encouraged.  Patient Name: Sydney Gerome ApleyCaitlin Benjamin [id number]'VToday's Date: 08/29/2014     Maternal Data    Feeding Feeding Type: Breast Fed Length of feed: 15 min  LATCH Score/Interventions                      Lactation Tools Discussed/Used     Consult Status      Huston FoleyMOULDEN, Timothy Trudell S 08/29/2014, 9:10 AM

## 2016-02-14 IMAGING — US US OB LIMITED
1 series · 13 of 21 positions shown · non-contrast
Comparison: none

[Series 1: us ob follow up · 13 of 21 slices shown]
[im 1/21]
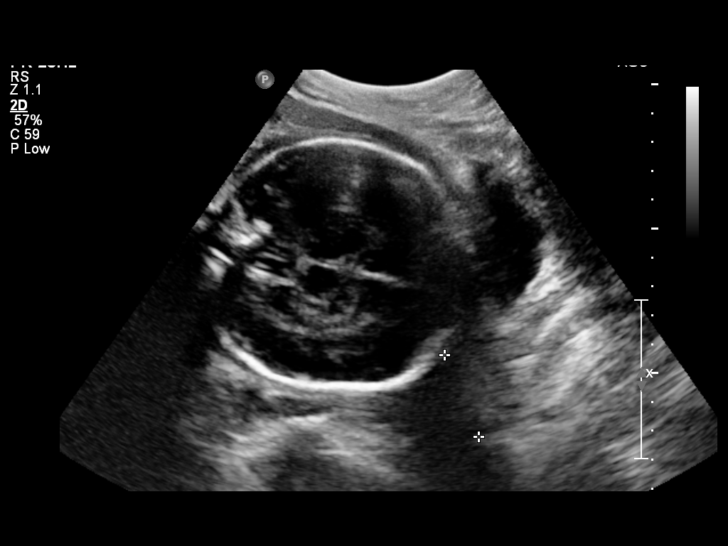
[im 3/21]
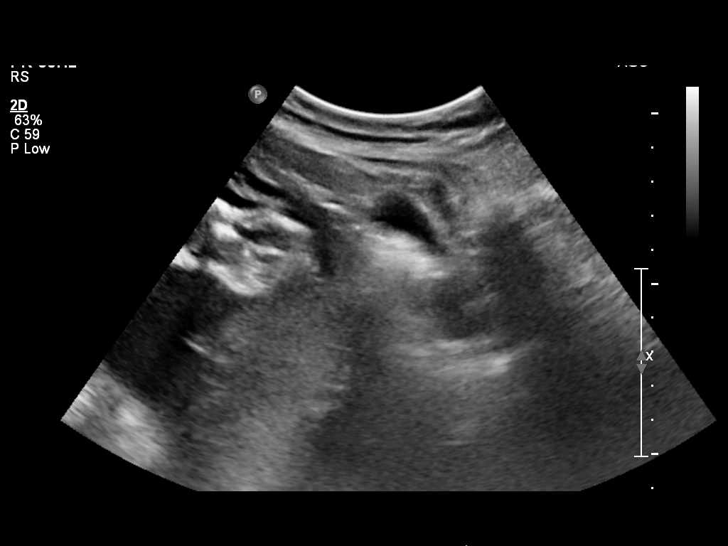
[im 5/21]
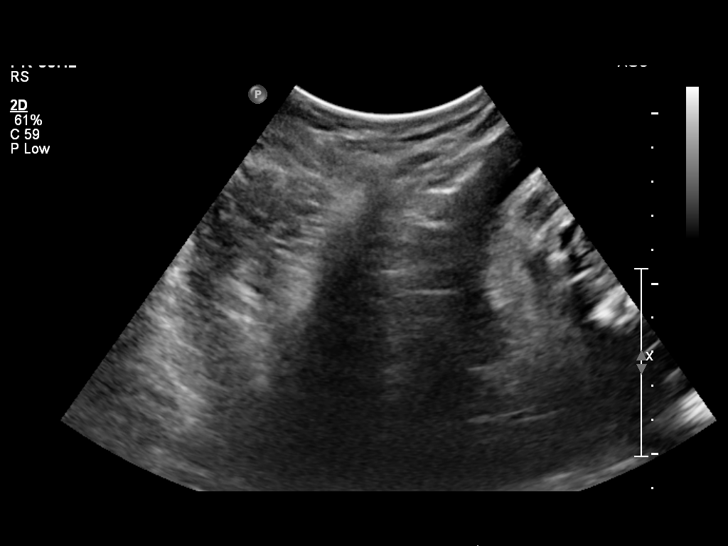
[im 6/21]
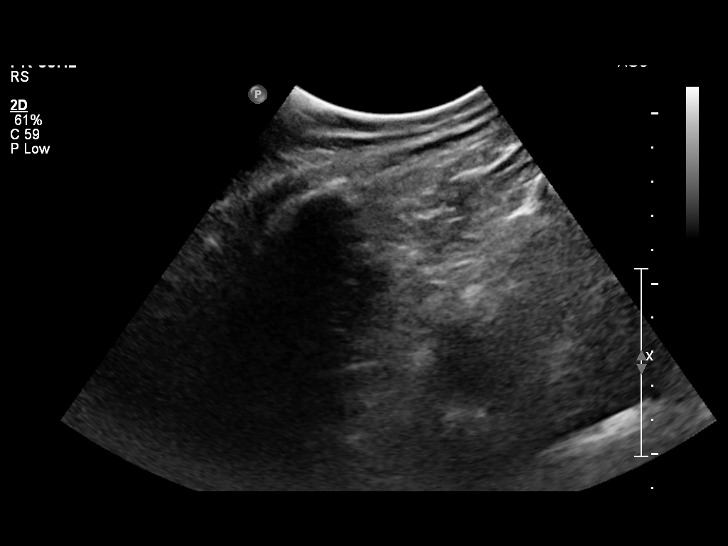
[im 8/21]
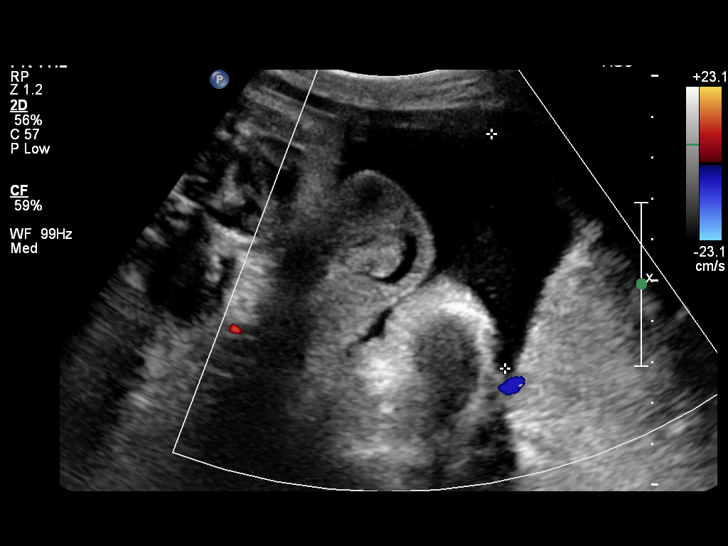
[im 9/21]
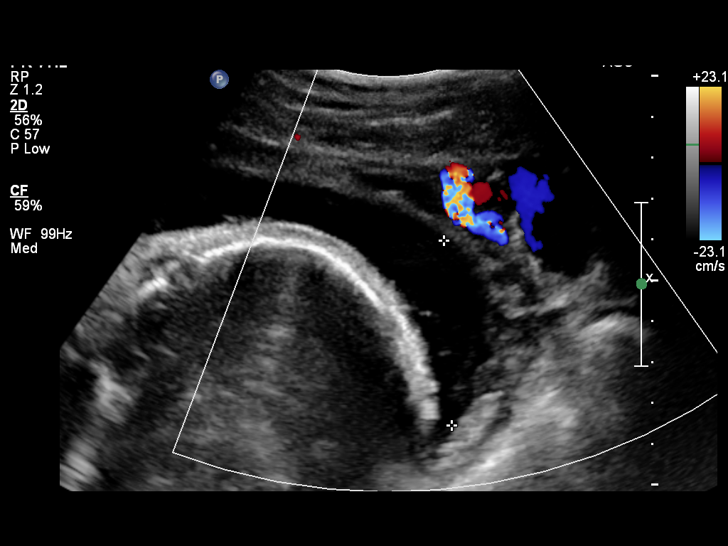
[im 11/21]
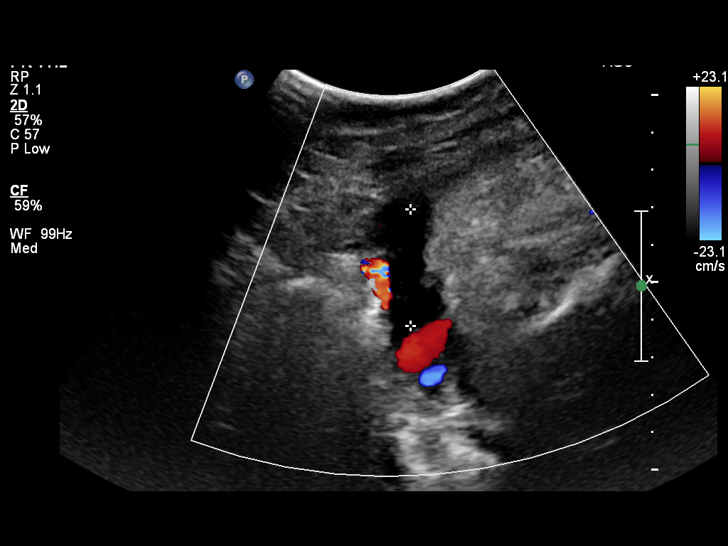
[im 13/21]
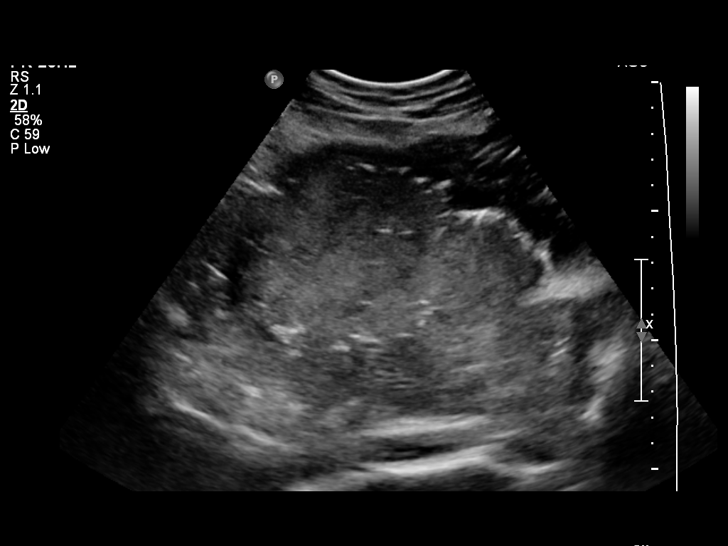
[im 14/21]
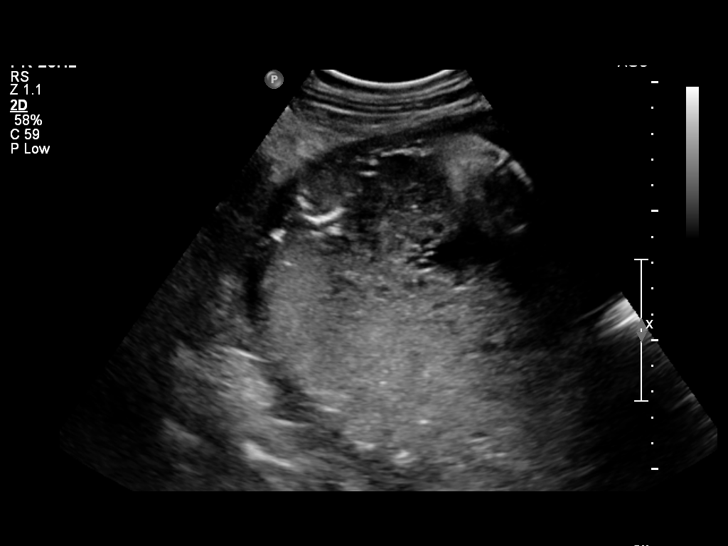
[im 16/21]
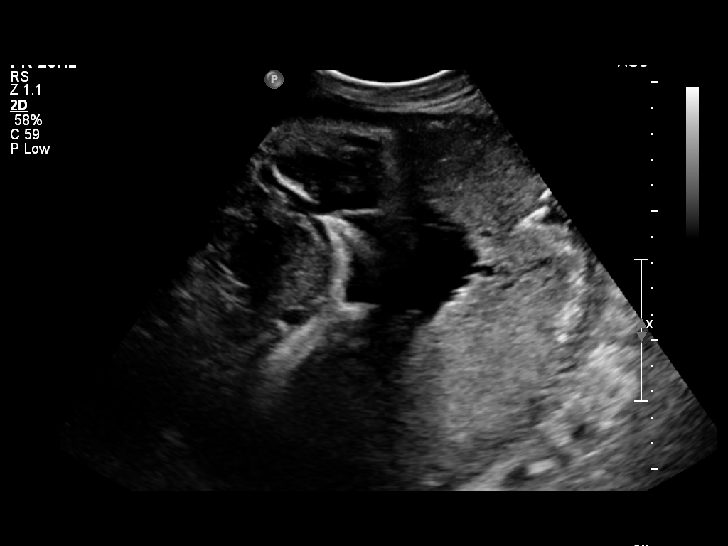
[im 17/21]
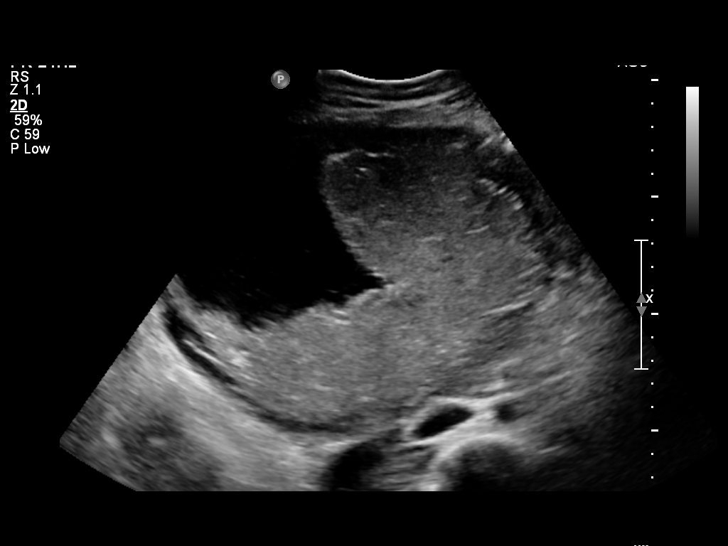
[im 19/21]
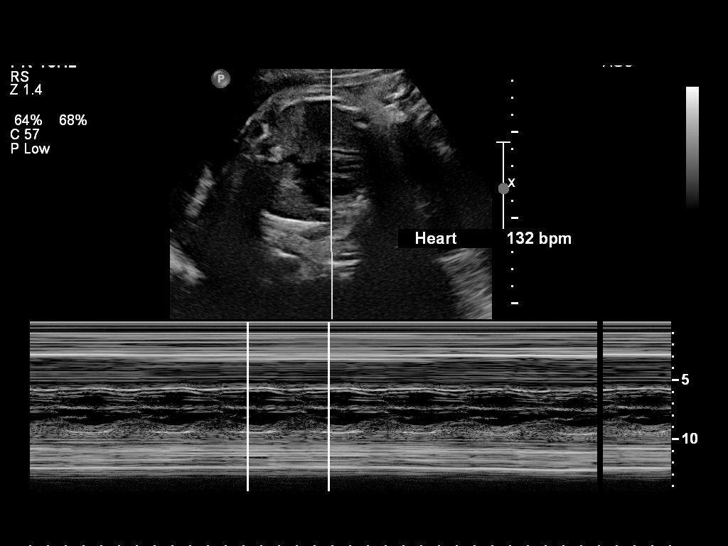
[im 21/21]
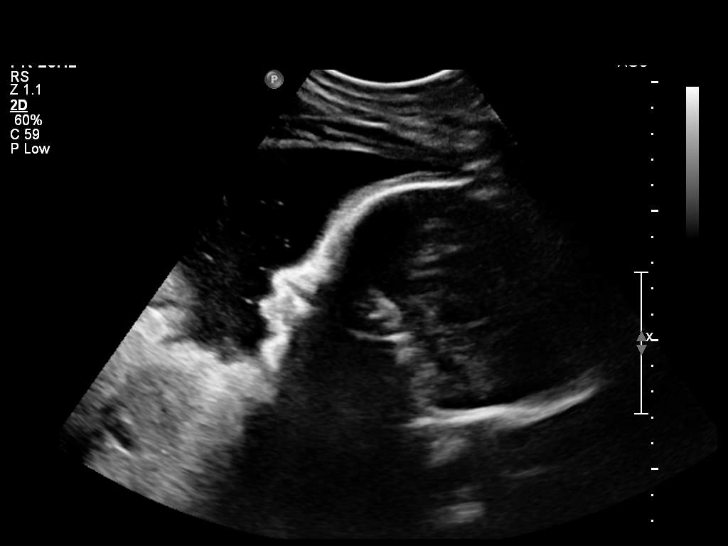

[13 of 21 positions shown; findings below may reference images not displayed]

OBSTETRICS REPORT
(Signed Final 07/23/2014 [DATE])

Service(s) Provided

[HOSPITAL]                                         76815.0
Indications

33 weeks gestation of pregnancy
Preterm labor without delivery, third trimester
Fetal Evaluation

Num Of Fetuses:    1
Fetal Heart Rate:  132                          bpm
Cardiac Activity:  Observed
Presentation:      Cephalic
Placenta:          Posterior, above cervical
os

Amniotic Fluid
AFI FV:      Subjectively within normal limits
AFI Sum:     19.34   cm       72  %Tile     Larg Pckt:    6.01  cm
RUQ:   6.01    cm   RLQ:    3.1    cm    LUQ:   5.72    cm   LLQ:    4.51   cm
Gestational Age

Clinical EDD:  33w 1d                                        EDD:   09/08/14
Best:          33w 1d     Det. By:  Clinical EDD             EDD:   09/08/14
Cervix Uterus Adnexa

Cervical Length:    3.06     cm

Cervix:       Normal appearance by transabdominal scan.

Left Ovary:    Not visualized.
Right Ovary:   Not visualized.
Adnexa:     No abnormality visualized. No adnexal mass visualized.
Impression

Singleton intrauterine pregnancy at 33 weeks 1 day gestation
with fetal cardiac activity
Cephalic presentation
Cervical length of >3cm
Recommendations
Follow-up ultrasounds as clinically indicated.

## 2016-06-13 ENCOUNTER — Encounter (HOSPITAL_COMMUNITY): Payer: Self-pay | Admitting: *Deleted

## 2016-06-13 ENCOUNTER — Inpatient Hospital Stay (HOSPITAL_COMMUNITY)
Admission: AD | Admit: 2016-06-13 | Discharge: 2016-06-13 | Disposition: A | Discharge status: Home / Self Care | Payer: 59 | Source: Ambulatory Visit | Attending: Obstetrics and Gynecology | Admitting: Obstetrics and Gynecology

## 2016-06-13 DIAGNOSIS — Z87891 Personal history of nicotine dependence: Secondary | ICD-10-CM | POA: Diagnosis not present

## 2016-06-13 DIAGNOSIS — Z3202 Encounter for pregnancy test, result negative: Secondary | ICD-10-CM | POA: Diagnosis not present

## 2016-06-13 DIAGNOSIS — O039 Complete or unspecified spontaneous abortion without complication: Secondary | ICD-10-CM | POA: Diagnosis not present

## 2016-06-13 DIAGNOSIS — N939 Abnormal uterine and vaginal bleeding, unspecified: Secondary | ICD-10-CM | POA: Diagnosis present

## 2016-06-13 HISTORY — DX: Attention-deficit hyperactivity disorder, unspecified type: F90.9

## 2016-06-13 LAB — URINALYSIS, ROUTINE W REFLEX MICROSCOPIC
Bilirubin Urine: NEGATIVE
Glucose, UA: NEGATIVE mg/dL
Ketones, ur: NEGATIVE mg/dL
Leukocytes, UA: NEGATIVE
Nitrite: NEGATIVE
Protein, ur: NEGATIVE mg/dL
Specific Gravity, Urine: 1.005 (ref 1.005–1.030)
pH: 6 (ref 5.0–8.0)

## 2016-06-13 LAB — CBC
HEMATOCRIT: 40.9 % (ref 36.0–46.0)
Hemoglobin: 13.8 g/dL (ref 12.0–15.0)
MCH: 31.7 pg (ref 26.0–34.0)
MCHC: 33.7 g/dL (ref 30.0–36.0)
MCV: 93.8 fL (ref 78.0–100.0)
Platelets: 209 10*3/uL (ref 150–400)
RBC: 4.36 MIL/uL (ref 3.87–5.11)
RDW: 12.3 % (ref 11.5–15.5)
WBC: 6.5 10*3/uL (ref 4.0–10.5)

## 2016-06-13 LAB — POCT PREGNANCY, URINE: Preg Test, Ur: NEGATIVE

## 2016-06-13 LAB — HCG, QUANTITATIVE, PREGNANCY: hCG, Beta Chain, Quant, S: <1 m[IU]/mL (ref <5)

## 2016-06-13 NOTE — Discharge Instructions (Signed)

## 2016-06-13 NOTE — MAU Note (Signed)
Has not been seen at [facility] office yet, was seen at primary 4/26  cramping, had urine and HCG done. (unable to see results).  Mild cramping in lower abd.  Had brownish spotting yesterday, changed to red today, soaking 'pantiliners'.

## 2016-06-13 NOTE — MAU Provider Note (Signed)
Chief Complaint: bleeding in pregnancy   First Provider Initiated Contact with Patient 06/13/16 1216        SUBJECTIVE HPI: Sydney Benjamin is a 31 y.o. G2P2002 at Unknown by LMP who presents to maternity admissions reporting bleeding and lower abdominal cramping.  Had gone to her primary doctor's office when she missed a period. Is currently on oral contraceptives,  Has not missed any pills.   States her primary doctor had a negative UPT and positive blood pregnancy test. . She denies vaginal itching/burning, urinary symptoms, h/a, dizziness, n/v, or fever/chills.    Vaginal Bleeding  The patient's primary symptoms include missed menses, pelvic pain and vaginal bleeding. The patient's pertinent negatives include no genital itching, genital lesions, genital odor, genital rash or vaginal discharge. This is a new problem. The current episode started in the past 7 days. The problem occurs intermittently. The problem has been gradually improving. The pain is mild. The problem affects both sides. Associated symptoms include abdominal pain. Pertinent negatives include no back pain, chills, constipation, diarrhea, dysuria, fever, headaches, nausea or vomiting. The vaginal discharge was bloody. The vaginal bleeding is lighter than menses. She has not been passing clots. She has not been passing tissue. Nothing aggravates the symptoms. She has tried nothing for the symptoms.   RN Note: Has not been seen at [facility] office yet, was seen at primary 4/26  cramping, had urine and HCG done. (unable to see results).  Mild cramping in lower abd.  Had brownish spotting yesterday, changed to red today, soaking 'pantiliners'.    Electronically signed by Job Founds Spurlock-Frizzell, RN at 06/13/2016 11:45 AM     Past Medical History:  Diagnosis Date  . ADHD   . HSV infection    Past Surgical History:  Procedure Laterality Date  . NO PAST SURGERIES     Social History   Social History  . Marital status: Single     Spouse name: N/A  . Number of children: N/A  . Years of education: N/A   Occupational History  . Not on file.   Social History Main Topics  . Smoking status: Former Games developer  . Smokeless tobacco: Never Used  . Alcohol use No     Comment: Occasional ETOH use - social drinker.  . Drug use: No  . Sexual activity: Yes     Comment: conceived on [employer]UN[place]   Other Topics Concern  . Not on file   Social History Narrative  . No narrative on file   No current facility-administered medications on file prior to encounter.    Current Outpatient Prescriptions on File Prior to Encounter  Medication Sig Dispense Refill  . ibuprofen (ADVIL,MOTRIN) 600 MG tablet Take 1 tablet (600 mg total) by mouth every 6 (six) hours as needed for moderate pain. 60 tablet 3  . oxyCODONE-acetaminophen (PERCOCET/ROXICET) 5-325 MG per tablet Take 1-2 tablets by mouth every 4 (four) hours as needed (for pain scale greater than 7). 30 tablet 0  . Prenatal Vit-Fe Fumarate-FA (PRENATAL MULTIVITAMIN) TABS tablet Take 1 tablet by mouth daily at 12 noon.    . ranitidine (ZANTAC) 150 MG tablet Take 150 mg by mouth 2 (two) times daily.     No Known Allergies  I have reviewed patient's Past Medical Hx, Surgical Hx, Family Hx, Social Hx, medications and allergies.   ROS:  Review of Systems  Constitutional: Negative for chills and fever.  Gastrointestinal: Positive for abdominal pain. Negative for constipation, diarrhea, nausea and vomiting.  Genitourinary: Positive for missed menses, pelvic pain and vaginal bleeding. Negative for dysuria and vaginal discharge.  Musculoskeletal: Negative for back pain.  Neurological: Negative for headaches.   Review of Systems  Other systems negative   Physical Exam  Physical Exam Patient Vitals for the past 24 hrs:  BP Temp Temp src Pulse Resp SpO2  06/13/16 1205 104/64 98.9 F (37.2 C) Oral 81 16 100 %   Constitutional: Well-developed, well-nourished female in no acute  distress.  Cardiovascular: normal rate and rhythm, no ectopic beats audible Respiratory: normal effort, clear to auscultation bilaterally GI: Abd soft, non-tender. Pos BS x 4 MS: Extremities nontender, no edema, normal ROM Neurologic: Alert and oriented x 4.  GU: Neg CVAT.  PELVIC EXAM: deferred  LAB RESULTS Results for orders placed or performed during the hospital encounter of 06/13/16 (from the past 24 hour(s))  Urinalysis, Routine w reflex microscopic     Status: Abnormal   Collection Time: 06/13/16 11:45 AM  Result Value Ref Range   Color, Urine STRAW (A) YELLOW   APPearance CLEAR CLEAR   Specific Gravity, Urine 1.005 1.005 - 1.030   pH 6.0 5.0 - 8.0   Glucose, UA NEGATIVE NEGATIVE mg/dL   Hgb urine dipstick LARGE (A) NEGATIVE   Bilirubin Urine NEGATIVE NEGATIVE   Ketones, ur NEGATIVE NEGATIVE mg/dL   Protein, ur NEGATIVE NEGATIVE mg/dL   Nitrite NEGATIVE NEGATIVE   Leukocytes, UA NEGATIVE NEGATIVE   RBC / HPF 0-5 0 - 5 RBC/hpf   WBC, UA 0-5 0 - 5 WBC/hpf   Bacteria, UA RARE (A) NONE SEEN   Squamous Epithelial / LPF 0-5 (A) NONE SEEN  Pregnancy, urine POC     Status: None   Collection Time: 06/13/16 11:50 AM  Result Value Ref Range   Preg Test, Ur NEGATIVE NEGATIVE  hCG, quantitative, pregnancy     Status: None   Collection Time: 06/13/16 12:37 PM  Result Value Ref Range   hCG, Beta Chain, Quant, S <1 <5 mIU/mL  CBC     Status: None   Collection Time: 06/13/16 12:37 PM  Result Value Ref Range   WBC 6.5 4.0 - 10.5 K/uL   RBC 4.36 3.87 - 5.11 MIL/uL   Hemoglobin 13.8 12.0 - 15.0 g/dL   HCT 96.2 95.2 - 84.1 %   MCV 93.8 78.0 - 100.0 fL   MCH 31.7 26.0 - 34.0 pg   MCHC 33.7 30.0 - 36.0 g/dL   RDW 32.4 40.1 - 02.7 %   Platelets 209 150 - 400 K/uL   Blood type is B +     IMAGING No results found.  MAU Management/MDM: Ordered Quant HCG and CBC  Consult Dr Dareen Piano with presentation, exam findings, and results.   Treatments in MAU included observation.    Discussed results with patient. This likely represents either a false positive test or a very early spontaneous abortion. Discussed how to restart OCPs (stated had not been planning pregnancy).  RN states when she went in room to discharge patient, patient was sobbing.    ASSESSMENT Positive pregnancy test in office, negative here Spontaneous abortion  PLAN Discharge home Plan to follow up in office.   Pt stable at time of discharge. Encouraged to return here or to other Urgent Care/ED if she develops worsening of symptoms, increase in pain, fever, or other concerning symptoms.    Wynelle Bourgeois CNM, MSN Certified Nurse-Midwife 06/13/2016  12:30 PM

## 2022-02-14 ENCOUNTER — Ambulatory Visit: Payer: 59
